# Patient Record
Sex: Male | Born: 1955 | Race: Black or African American | Hispanic: No | Marital: Single | Smoking: Never smoker
Health system: Southern US, Community
[De-identification: ages and names within clinical notes are randomized; demographics above are authoritative.]

## PROBLEM LIST (undated history)

## (undated) DIAGNOSIS — Z789 Other specified health status: Secondary | ICD-10-CM

## (undated) DIAGNOSIS — H5789 Other specified disorders of eye and adnexa: Secondary | ICD-10-CM

## (undated) HISTORY — PX: TONSILLECTOMY: SUR1361

---

## 2014-11-03 ENCOUNTER — Telehealth: Payer: Self-pay

## 2014-11-03 NOTE — Telephone Encounter (Signed)
Pt called this morning and gave me his triage info. He is at work and he will call me this afternoon for date and time.

## 2014-11-04 NOTE — Telephone Encounter (Signed)
Gastroenterology Pre-Procedure Review  Request Date: 11/03/2014 Requesting Physician: Dr. Legrand Rams  PATIENT REVIEW QUESTIONS: The patient responded to the following health history questions as indicated:    1. Diabetes Melitis: no 2. Joint replacements in the past 12 months: no 3. Major health problems in the past 3 months: no 4. Has an artificial valve or MVP: no 5. Has a defibrillator: no 6. Has been advised in past to take antibiotics in advance of a procedure like teeth cleaning: no    MEDICATIONS & ALLERGIES:    Patient reports the following regarding taking any blood thinners:   Plavix? no Aspirin? no Coumadin? no  Patient confirms/reports the following medications:  No current outpatient prescriptions on file.   No current facility-administered medications for this visit.    Patient confirms/reports the following allergies:  Allergies  Allergen Reactions  . Penicillins Other (See Comments)    PT SAID HE PASSED OUT MANY YEARS AGO FROM PENICILLIN    No orders of the defined types were placed in this encounter.    AUTHORIZATION INFORMATION Primary Insurance:   ID #:  Group #: Pre-Cert / Auth required:  Pre-Cert / Auth #:   Secondary Insurance:   ID #:   Group #:  Pre-Cert / Auth required:  Pre-Cert / Auth #:   SCHEDULE INFORMATION: Procedure has been scheduled as follows:  Date: 11/14/2014          Time:  9:30 AM [facility] Short Stay  This Gastroenterology Pre-Precedure Review Form is being routed to the following provider(s): R. Garfield Cornea, MD

## 2014-11-08 ENCOUNTER — Other Ambulatory Visit: Payer: Self-pay

## 2014-11-08 DIAGNOSIS — Z1211 Encounter for screening for malignant neoplasm of colon: Secondary | ICD-10-CM

## 2014-11-08 MED ORDER — PEG-KCL-NACL-NASULF-NA ASC-C 100 G PO SOLR: 1.0000 | ORAL | Status: DC

## 2014-11-08 NOTE — Telephone Encounter (Signed)
Rx sent to the pharmacy and instructions mailed to pt.  

## 2014-11-08 NOTE — Telephone Encounter (Signed)
[place]  Please notify us immediately if you are diabetic, take iron supplements, or if you are on coumadin or any blood thinners.  Patient Name:  Ryan Frost Date of procedure:  11/14/2014 Time to register at [facility] Stay:  8:30 AM Provider:  Dr. Gala Romney  Please hold the following medications: N/A  11/12/2014  2 Days prior to procedure:  [place]!  11/13/2014  1 Day prior to procedure:     CLEAR LIQUIDS ALL DAY--NO SOLID FOODS!  Diabetic Medication Instructions:  N/A   At 5:00 PM Begin the prep as follows:     Empty 1 pouch A and 1 pouch B into disposable container.  Add lukewarm drinking water to the top line of the container.  Mix to dissolve.  You can mix solution ahead of time & refrigerate prior to drinking.  The solution should be used within 24 hours.  The container is divided by 4 marks.  Every 15 minutes, drink the solution down to the next mark (approx 8 oz) until the liter is complete.  Be sure to drink 16ounces of clear liquid of your choice   At 7:30 PM repeat:  Empty 1 pouch A and 1 pouch B into disposable container.  Add lukewarm drinking water to the top line of the container.  Mix to dissolve.  You can mix solution ahead of time & refrigerate prior to drinking.  The solution should be used within 24 hours.  The container is divided by 4 marks.  Every 15 minutes, drink the solution down to the next mark (approx 8 oz) until the liter is complete.  You must complete the entire prep to ensure the most effective cleaning.   CONTINUE CLEAR LIQUIDS UNTIL MIDNIGHT.     NOTHING TO EAT OR DRINK AFTER MIDNIGHT  11/14/2014  Day of Procedure  Give yourself one Fleet enema about 1 hour prior to leaving for the hospital.  You may take TYLENOL products.  Please continue your regular medications unless we have instructed you otherwise.   Please note, on the day of your procedure you MUST be accompanied  by an adult who is willing to assume responsibility for you at time of discharge. If you do not have such person with you, your procedure will have to be rescheduled.  *It is your responsibility to check with your insurance company for the benefits of coverage you have for this procedure. Unfortunately, not all insurance companies have benefits to cover all or part of these types of procedures. It is your responsibility to check your benefits, however we will be glad to assist you with any codes your insurance company may need.   Please note that most insurance companies will not cover a screening colonoscopy for people under the age of 58. For example, with some insurance companies you may have benefits for a screening colonoscopy, but if polyps are found the diagnosis will change and then you may have a deductible that will need to be met. Please make sure you check your benefits for screening colonoscopy as well as a diagnostic colonoscopy.   CLEAR LIQUIDS: (NO RED) Jello Apple Juice  White Grape Juice Water Banana popsicles  Kool-Aid  Coffee(No cream or milk) Tea (No cream or milk) Soft drinks Broth (fat free beef/chicken/vegetable)  Clear liquids allow you to see your fingers on the other side of the glass.  Be sure they are NOT RED in color, cloudy, but CLEAR.  Do Not Eat: Dairy products of any kind Cranberry juice Tomato or V8 Juice  Orange Juice Grapefruit Juice  Red Grape Juice Solid foods like cereal, oatmeal, yogurt, fruits, vegetables, creamed soups, eggs, bread, etc   HELPFUL HINTS TO MAKE DRINKING EASIER: -Make sure prep is extremely COLD.  Refrigerate the night before.  You may also put in freezer. -You may try mixing Crystal Light or Country Time Lemonade if you prefer.  MIx in small amounts.  Add more if necessary. -Trying drinking through a straw. -Rinse mouth with water or mouthwash between glasses to remove aftertaste. -Try sipping on a cold beverage/ice popsicles  between glasses of prep. -Place a piece of sugar-free hard candy in mouth between glasses. -If you become nauseated, try consuming smaller amounts or stretch out the time between glasses.  Stop for 30 minutes to an hour & slowly start back drinking.  Call our office with any questions or concerns at [phone].  Thank You,  _0 @

## 2014-11-08 NOTE — Telephone Encounter (Signed)
OK to schedule

## 2014-11-09 ENCOUNTER — Telehealth: Payer: Self-pay

## 2014-11-09 NOTE — Telephone Encounter (Signed)
I called [employer] @1 [phone] and spoke to [place] who said that a PA is not required for screening colonoscopy as outpatient.

## 2014-11-11 NOTE — Telephone Encounter (Signed)
Pt's wife called and said he had not received his instructions for the prep in the mail. He has already picked up the prescription. She did not have a fax that I could fax the instructions to so I told her I will fax them to the pharmacy with a note to the pharmacist that the pt has already picked up his prescription. Faxed the instructions to: [phone].  I called the pharmacy @ [phone] and spoke to tech, Katharine Look, who said they have received the instructions and will keep them for the pt to pick up.

## 2014-11-14 ENCOUNTER — Encounter (HOSPITAL_COMMUNITY)
Admission: RE | Disposition: A | Discharge status: Home / Self Care | Payer: Self-pay | Source: Ambulatory Visit | Attending: Internal Medicine

## 2014-11-14 ENCOUNTER — Encounter (HOSPITAL_COMMUNITY): Payer: Self-pay | Admitting: *Deleted

## 2014-11-14 ENCOUNTER — Ambulatory Visit (HOSPITAL_COMMUNITY)
Admission: RE | Admit: 2014-11-14 | Discharge: 2014-11-14 | Disposition: A | Discharge status: Home / Self Care | Payer: BLUE CROSS/BLUE SHIELD | Source: Ambulatory Visit | Attending: Internal Medicine | Admitting: Internal Medicine

## 2014-11-14 DIAGNOSIS — K5731 Diverticulosis of large intestine without perforation or abscess with bleeding: Secondary | ICD-10-CM | POA: Insufficient documentation

## 2014-11-14 DIAGNOSIS — Z880 Allergy status to penicillin: Secondary | ICD-10-CM | POA: Diagnosis not present

## 2014-11-14 DIAGNOSIS — D123 Benign neoplasm of transverse colon: Secondary | ICD-10-CM | POA: Diagnosis not present

## 2014-11-14 DIAGNOSIS — K5730 Diverticulosis of large intestine without perforation or abscess without bleeding: Secondary | ICD-10-CM | POA: Diagnosis not present

## 2014-11-14 DIAGNOSIS — Z86010 Personal history of colonic polyps: Secondary | ICD-10-CM | POA: Insufficient documentation

## 2014-11-14 DIAGNOSIS — Z1211 Encounter for screening for malignant neoplasm of colon: Secondary | ICD-10-CM | POA: Diagnosis present

## 2014-11-14 HISTORY — PX: COLONOSCOPY: SHX5424

## 2014-11-14 HISTORY — DX: Other specified disorders of eye and adnexa: H57.89

## 2014-11-14 HISTORY — DX: Other specified health status: Z78.9

## 2014-11-14 SURGERY — COLONOSCOPY
Anesthesia: Moderate Sedation

## 2014-11-14 MED ORDER — SODIUM CHLORIDE 0.9 % IV SOLN
INTRAVENOUS | Status: DC
  Administered 2014-11-14: 09:00:00 via INTRAVENOUS

## 2014-11-14 MED ORDER — MIDAZOLAM HCL 5 MG/5ML IJ SOLN
INTRAMUSCULAR | Status: AC
  Filled 2014-11-14: qty 10

## 2014-11-14 MED ORDER — ONDANSETRON HCL 4 MG/2ML IJ SOLN
INTRAMUSCULAR | Status: DC | PRN
  Administered 2014-11-14: 4 mg via INTRAVENOUS

## 2014-11-14 MED ORDER — MEPERIDINE HCL 100 MG/ML IJ SOLN
INTRAMUSCULAR | Status: DC | PRN
  Administered 2014-11-14: 25 mg via INTRAVENOUS
  Administered 2014-11-14: 50 mg via INTRAVENOUS

## 2014-11-14 MED ORDER — MEPERIDINE HCL 100 MG/ML IJ SOLN
INTRAMUSCULAR | Status: AC
  Filled 2014-11-14: qty 2

## 2014-11-14 MED ORDER — STERILE WATER FOR IRRIGATION IR SOLN
Status: DC | PRN
  Administered 2014-11-14: 09:00:00

## 2014-11-14 MED ORDER — MIDAZOLAM HCL 5 MG/5ML IJ SOLN
INTRAMUSCULAR | Status: DC | PRN
  Administered 2014-11-14: 1 mg via INTRAVENOUS
  Administered 2014-11-14: 2 mg via INTRAVENOUS
  Administered 2014-11-14: 1 mg via INTRAVENOUS

## 2014-11-14 MED ORDER — ONDANSETRON HCL 4 MG/2ML IJ SOLN
INTRAMUSCULAR | Status: AC
  Filled 2014-11-14: qty 2

## 2014-11-14 NOTE — H&P (Signed)
@  SUPJ@   Primary Care Physician:  Rosita Fire, MD Primary Gastroenterologist:  Dr. Gala Romney  Pre-Procedure History & Physical: HPI:  Ryan Frost is a 59 y.o. male is here for a screening colonoscopy. No bowel symptoms. No prior colonoscopy. No family history of colon cancer.  Past Medical History  Diagnosis Date  . Medical history non-contributory   . Red eye     left eye    Past Surgical History  Procedure Laterality Date  . Tonsillectomy      Prior to Admission medications   Medication Sig Start Date End Date Taking? Authorizing Provider  peg 3350 powder (MOVIPREP) 100 G SOLR Take 1 kit (200 g total) by mouth as directed. 11/08/14  Yes Daneil Dolin, MD    Allergies as of 11/08/2014 - Review Complete 11/08/2014  Allergen Reaction Noted  . Penicillins Other (See Comments) 11/03/2014    History reviewed. No pertinent family history.  History   Social History  . Marital Status: Single    Spouse Name: N/A    Number of Children: N/A  . Years of Education: N/A   Occupational History  . Not on file.   Social History Main Topics  . Smoking status: Never Smoker   . Smokeless tobacco: Not on file  . Alcohol Use: Yes     Comment: 2 or 3 liquor on weekend  . Drug Use: Not on file  . Sexual Activity: Not on file   Other Topics Concern  . Not on file   Social History Narrative  . No narrative on file    Review of Systems: See HPI, otherwise negative ROS  Physical Exam: BP 130/85 mmHg  Pulse 59  Temp(Src) 98.1 F (36.7 C) (Oral)  Resp 18  SpO2 97% General:   Alert,  Well-developed, well-nourished, pleasant and cooperative in NAD Head:  Normocephalic and atraumatic. Eyes:  Sclera clear, no icterus.   Conjunctiva pink. Ears:  Normal auditory acuity. Nose:  No deformity, discharge,  or lesions. Mouth:  No deformity or lesions, dentition normal. Neck:  Supple; no masses or thyromegaly. Lungs:  Clear throughout to auscultation.   No wheezes, crackles,  or rhonchi. No acute distress. Heart:  Regular rate and rhythm; no murmurs, clicks, rubs,  or gallops. Abdomen:  Soft, nontender and nondistended. No masses, hepatosplenomegaly or hernias noted. Normal bowel sounds, without guarding, and without rebound.   Msk:  Symmetrical without gross deformities. Normal posture. Pulses:  Normal pulses noted. Extremities:  Without clubbing or edema. Neurologic:  Alert and  oriented x4;  grossly normal neurologically. Skin:  Intact without significant lesions or rashes. Cervical Nodes:  No significant cervical adenopathy. Psych:  Alert and cooperative. Normal mood and affect.  Impression/Plan: Ryan Frost is now here to undergo a screening colonoscopy.  First ever average risk screening examination. Risks, benefits, limitations, imponderables and alternatives regarding colonoscopy have been reviewed with the patient. Questions have been answered. All parties agreeable.     Notice:  This dictation was prepared with Dragon dictation along with smaller phrase technology. Any transcriptional errors that result from this process are unintentional and may not be corrected upon review.

## 2014-11-14 NOTE — Op Note (Signed)
[facility] [address] [place], [address]   COLONOSCOPY PROCEDURE REPORT  PATIENT: Ryan Frost, Ryan Frost  MR#: [id number] BIRTHDATE: [birth year] , 80  yrs. old GENDER: male ENDOSCOPIST: R.  Garfield Cornea, MD FACP [facility] REFERRED AO:[id number] Legrand Rams, M.D. PROCEDURE DATE:  December 07, 2014 PROCEDURE:   Colonoscopy with biopsy and Colonoscopy with snare polypectomy INDICATIONS:First ever average risk colorectal cancer screening examination. MEDICATIONS: Versed 4 mg IV and Demerol 75 mg IV in divided doses. Zofran 4 mg IV. ASA CLASS:       Class II  CONSENT: The risks, benefits, alternatives and imponderables including but not limited to bleeding, perforation as well as the possibility of a missed lesion have been reviewed.  The potential for biopsy, lesion removal, etc. have also been discussed. Questions have been answered.  All parties agreeable.  Please see the history and physical in the medical record for more information.  DESCRIPTION OF PROCEDURE:   After the risks benefits and alternatives of the procedure were thoroughly explained, informed consent was obtained.  The digital rectal exam revealed no abnormalities of the rectum.   The EC-3890Li ([id number])  endoscope was introduced through the anus and advanced to the cecum, which was identified by both the appendix and ileocecal valve. No adverse events experienced.   The quality of the prep was adequate.  The instrument was then slowly withdrawn as the colon was fully examined.      COLON FINDINGS: Normal-appearing rectum.  normal retroflexion (photographed but technical difficulties precluded capturing of the image).  Scattered pancolonic diverticula; (1) 6 mm polyp at the splenic flexure and a second diminutive polyp in the mid transverse segment; otherwise, remainder of the colonic mucosa appeared normal.   The above-mentioned polyps were cold biopsied with jumbo biopsy forceps and hot snare removed.   Retroflexion was performed. .  Withdrawal time=15 minutes 0 seconds.  The scope was withdrawn and the procedure completed. COMPLICATIONS: There were no immediate complications.  ENDOSCOPIC IMPRESSION: Pancolonic diverticulosis. Multiple colonic polyps?"removed as described above.  RECOMMENDATIONS: Follow-up on pathology.  eSigned:  R. Garfield Cornea, MD Rosalita Chessman [facility] Dec 07, 2014 9:44 AM   cc:  CPT CODES: ICD CODES:  The ICD and CPT codes recommended by this software are interpretations from the data that the clinical staff has captured with the software.  The verification of the translation of this report to the ICD and CPT codes and modifiers is the sole responsibility of the health care institution and practicing physician where this report was generated.  [employer] will not be held responsible for the validity of the ICD and CPT codes included on this report.  AMA assumes no liability for data contained or not contained herein. CPT is a Designer, television/film set of the [employer].  PATIENT NAME:  Ryan Frost, Ryan Frost MR#: [id number]

## 2014-11-14 NOTE — Discharge Instructions (Signed)
Diverticulosis and polyp information provided  Further recommendations to follow pending review of pathology report   Colonoscopy Discharge Instructions  Read the instructions outlined below and refer to this sheet in the next few weeks. These discharge instructions provide you with general information on caring for yourself after you leave the hospital. Your doctor may also give you specific instructions. While your treatment has been planned according to the most current medical practices available, unavoidable complications occasionally occur. If you have any problems or questions after discharge, call Dr. Gala Romney at [phone]. ACTIVITY  You may resume your regular activity, but move at a slower pace for the next 24 hours.   Take frequent rest periods for the next 24 hours.   Walking will help get rid of the air and reduce the bloated feeling in your belly (abdomen).   No driving for 24 hours (because of the medicine (anesthesia) used during the test).    Do not sign any important legal documents or operate any machinery for 24 hours (because of the anesthesia used during the test).  NUTRITION  Drink plenty of fluids.   You may resume your normal diet as instructed by your doctor.   Begin with a light meal and progress to your normal diet. Heavy or fried foods are harder to digest and may make you feel sick to your stomach (nauseated).   Avoid alcoholic beverages for 24 hours or as instructed.  MEDICATIONS  You may resume your normal medications unless your doctor tells you otherwise.  WHAT YOU CAN EXPECT TODAY  Some feelings of bloating in the abdomen.   Passage of more gas than usual.   Spotting of blood in your stool or on the toilet paper.  IF YOU HAD POLYPS REMOVED DURING THE COLONOSCOPY:  No aspirin products for 7 days or as instructed.   No alcohol for 7 days or as instructed.   Eat a soft diet for the next 24 hours.  FINDING OUT THE RESULTS OF YOUR TEST Not all  test results are available during your visit. If your test results are not back during the visit, make an appointment with your caregiver to find out the results. Do not assume everything is normal if you have not heard from your caregiver or the medical facility. It is important for you to follow up on all of your test results.  SEEK IMMEDIATE MEDICAL ATTENTION IF:  You have more than a spotting of blood in your stool.   Your belly is swollen (abdominal distention).   You are nauseated or vomiting.   You have a temperature over 101.   You have abdominal pain or discomfort that is severe or gets worse throughout the day.    Diverticulosis Diverticulosis is the condition that develops when small pouches (diverticula) form in the wall of your colon. Your colon, or large intestine, is where water is absorbed and stool is formed. The pouches form when the inside layer of your colon pushes through weak spots in the outer layers of your colon. CAUSES  No one knows exactly what causes diverticulosis. RISK FACTORS  Being older than 32. Your risk for this condition increases with age. Diverticulosis is rare in people younger than 40 years. By age 30, almost everyone has it.  Eating a low-fiber diet.  Being frequently constipated.  Being overweight.  Not getting enough exercise.  Smoking.  Taking over-the-counter pain medicines, like aspirin and ibuprofen. SYMPTOMS  Most people with diverticulosis do not have symptoms. DIAGNOSIS  diverticulosis often has no symptoms, health care providers often discover the condition during an exam for other colon problems. In many cases, a health care provider will diagnose diverticulosis while using a flexible scope to examine the colon (colonoscopy). °TREATMENT  °If you have never developed an infection related to diverticulosis, you may not need treatment. If you have had an infection before, treatment may include: °· Eating more fruits,  vegetables, and grains. °· Taking a fiber supplement. °· Taking a live bacteria supplement (probiotic). °· Taking medicine to relax your colon. °HOME CARE INSTRUCTIONS  °· Drink at least 6-8 glasses of water each day to prevent constipation. °· Try not to strain when you have a bowel movement. °· Keep all follow-up appointments. °If you have had an infection before:  °· Increase the fiber in your diet as directed by your health care provider or dietitian. °· Take a dietary fiber supplement if your health care provider approves. °· Only take medicines as directed by your health care provider. °SEEK MEDICAL CARE IF:  °· You have abdominal pain. °· You have bloating. °· You have cramps. °· You have not gone to the bathroom in 3 days. °SEEK IMMEDIATE MEDICAL CARE IF:  °· Your pain gets worse. °· Your bloating becomes very bad. °· You have a fever or chills, and your symptoms suddenly get worse. °· You begin vomiting. °· You have bowel movements that are bloody or black. °MAKE SURE YOU: °· Understand these instructions. °· Will watch your condition. °· Will get help right away if you are not doing well or get worse. °Document Released: 07/11/2004 Document Revised: 10/19/2013 Document Reviewed: 09/08/2013 °Exit[employer]® Patient Information ©2015 [employer]. This information is not intended to replace advice given to you by your health care provider. Make sure you discuss any questions you have with your health care provider. ° ° °Colon Polyps °Polyps are lumps of extra tissue growing inside the body. Polyps can grow in the large intestine (colon). Most colon polyps are noncancerous (benign). However, some colon polyps can become cancerous over time. Polyps that are larger than a pea may be harmful. To be safe, caregivers remove and test all polyps. °CAUSES  °Polyps form when mutations in the genes cause your cells to grow and divide even though no more tissue is needed. °RISK FACTORS °There are a number of risk factors  that can increase your chances of getting colon polyps. They include: °· Being older than 50 years. °· Family history of colon polyps or colon cancer. °· Long-term colon diseases, such as colitis or Crohn disease. °· Being overweight. °· Smoking. °· Being inactive. °· Drinking too much alcohol. °SYMPTOMS  °Most small polyps do not cause symptoms. If symptoms are present, they may include: °· Blood in the stool. The stool may look dark red or black. °· Constipation or diarrhea that lasts longer than 1 week. °DIAGNOSIS °People often do not know they have polyps until their caregiver finds them during a regular checkup. Your caregiver can use 4 tests to check for polyps: °· Digital rectal exam. The caregiver wears gloves and feels inside the rectum. This test would find polyps only in the rectum. °· Barium enema. The caregiver puts a liquid called barium into your rectum before taking X-rays of your colon. Barium makes your colon look Sewell Pitner. Polyps are dark, so they are easy to see in the X-ray pictures. °· Sigmoidoscopy. A thin, flexible tube (sigmoidoscope) is placed into your rectum. The sigmoidoscope has a light and tiny camera   camera in it. The caregiver uses the sigmoidoscope to look at the last third of your colon.  Colonoscopy. This test is like sigmoidoscopy, but the caregiver looks at the entire colon. This is the most common method for finding and removing polyps. TREATMENT  Any polyps will be removed during a sigmoidoscopy or colonoscopy. The polyps are then tested for cancer. PREVENTION  To help lower your risk of getting more colon polyps:  Eat plenty of fruits and vegetables. Avoid eating fatty foods.  Do not smoke.  Avoid drinking alcohol.  Exercise every day.  Lose weight if recommended by your caregiver.  Eat plenty of calcium and folate. Foods that are rich in calcium include milk, cheese, and broccoli. Foods that are rich in folate include chickpeas, kidney beans, and spinach. HOME CARE  INSTRUCTIONS Keep all follow-up appointments as directed by your caregiver. You may need periodic exams to check for polyps. SEEK MEDICAL CARE IF: You notice bleeding during a bowel movement. Document Released: 07/10/2004 Document Revised: 01/06/2012 Document Reviewed: 12/24/2011 [facility] Patient Information 2015 [place], [place]. This information is not intended to replace advice given to you by your health care provider. Make sure you discuss any questions you have with your health care provider.

## 2014-11-15 ENCOUNTER — Encounter: Payer: Self-pay | Admitting: Internal Medicine

## 2014-11-15 ENCOUNTER — Encounter (HOSPITAL_COMMUNITY): Payer: Self-pay | Admitting: Internal Medicine

## 2014-12-06 ENCOUNTER — Other Ambulatory Visit (HOSPITAL_COMMUNITY): Payer: Self-pay | Admitting: Internal Medicine

## 2014-12-06 ENCOUNTER — Ambulatory Visit (HOSPITAL_COMMUNITY)
Admission: RE | Admit: 2014-12-06 | Discharge: 2014-12-06 | Disposition: A | Discharge status: Home / Self Care | Payer: BLUE CROSS/BLUE SHIELD | Source: Ambulatory Visit | Attending: Internal Medicine | Admitting: Internal Medicine

## 2014-12-06 DIAGNOSIS — M25551 Pain in right hip: Secondary | ICD-10-CM | POA: Diagnosis present

## 2014-12-06 DIAGNOSIS — M1611 Unilateral primary osteoarthritis, right hip: Secondary | ICD-10-CM | POA: Insufficient documentation

## 2016-03-06 IMAGING — DX DG HIP (WITH OR WITHOUT PELVIS) 2-3V*R*
3 series · 3 of 3 positions shown · non-contrast
Comparison: None.

CLINICAL DATA: Right hip pain with no known injury.

EXAM:
RIGHT HIP (WITH PELVIS) 2-3 VIEWS

[pelvis ap]
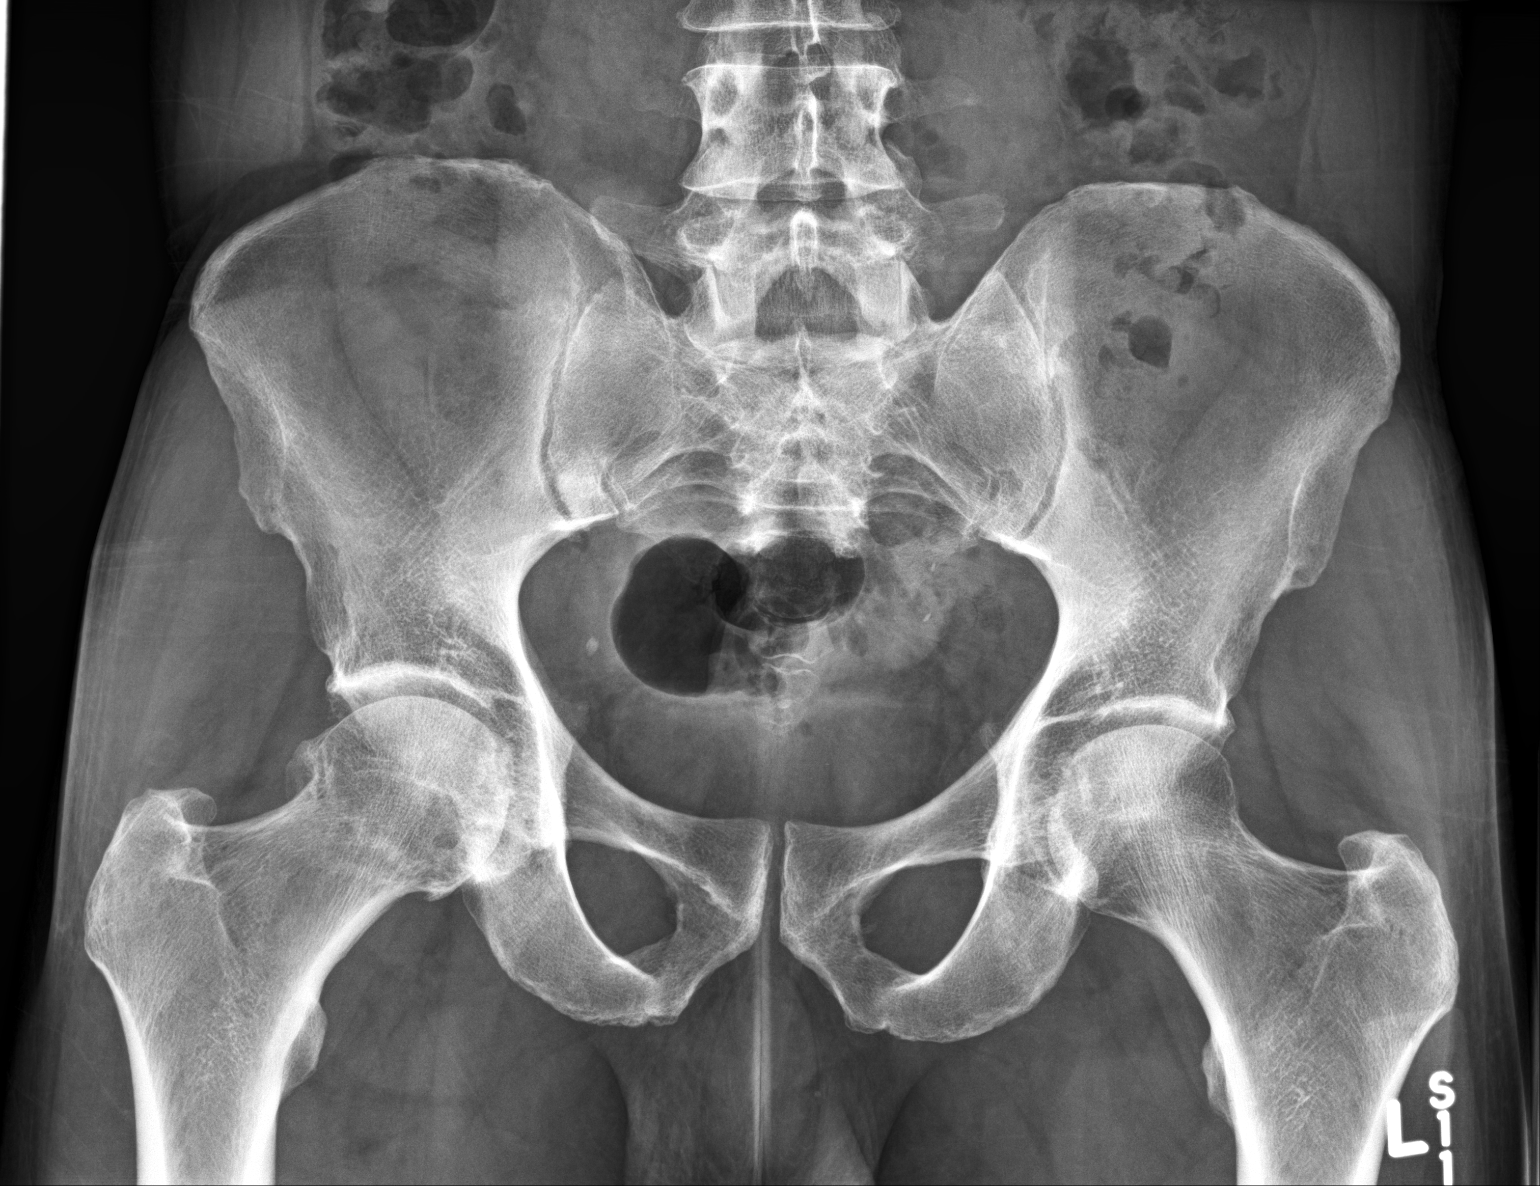

[hip ap]
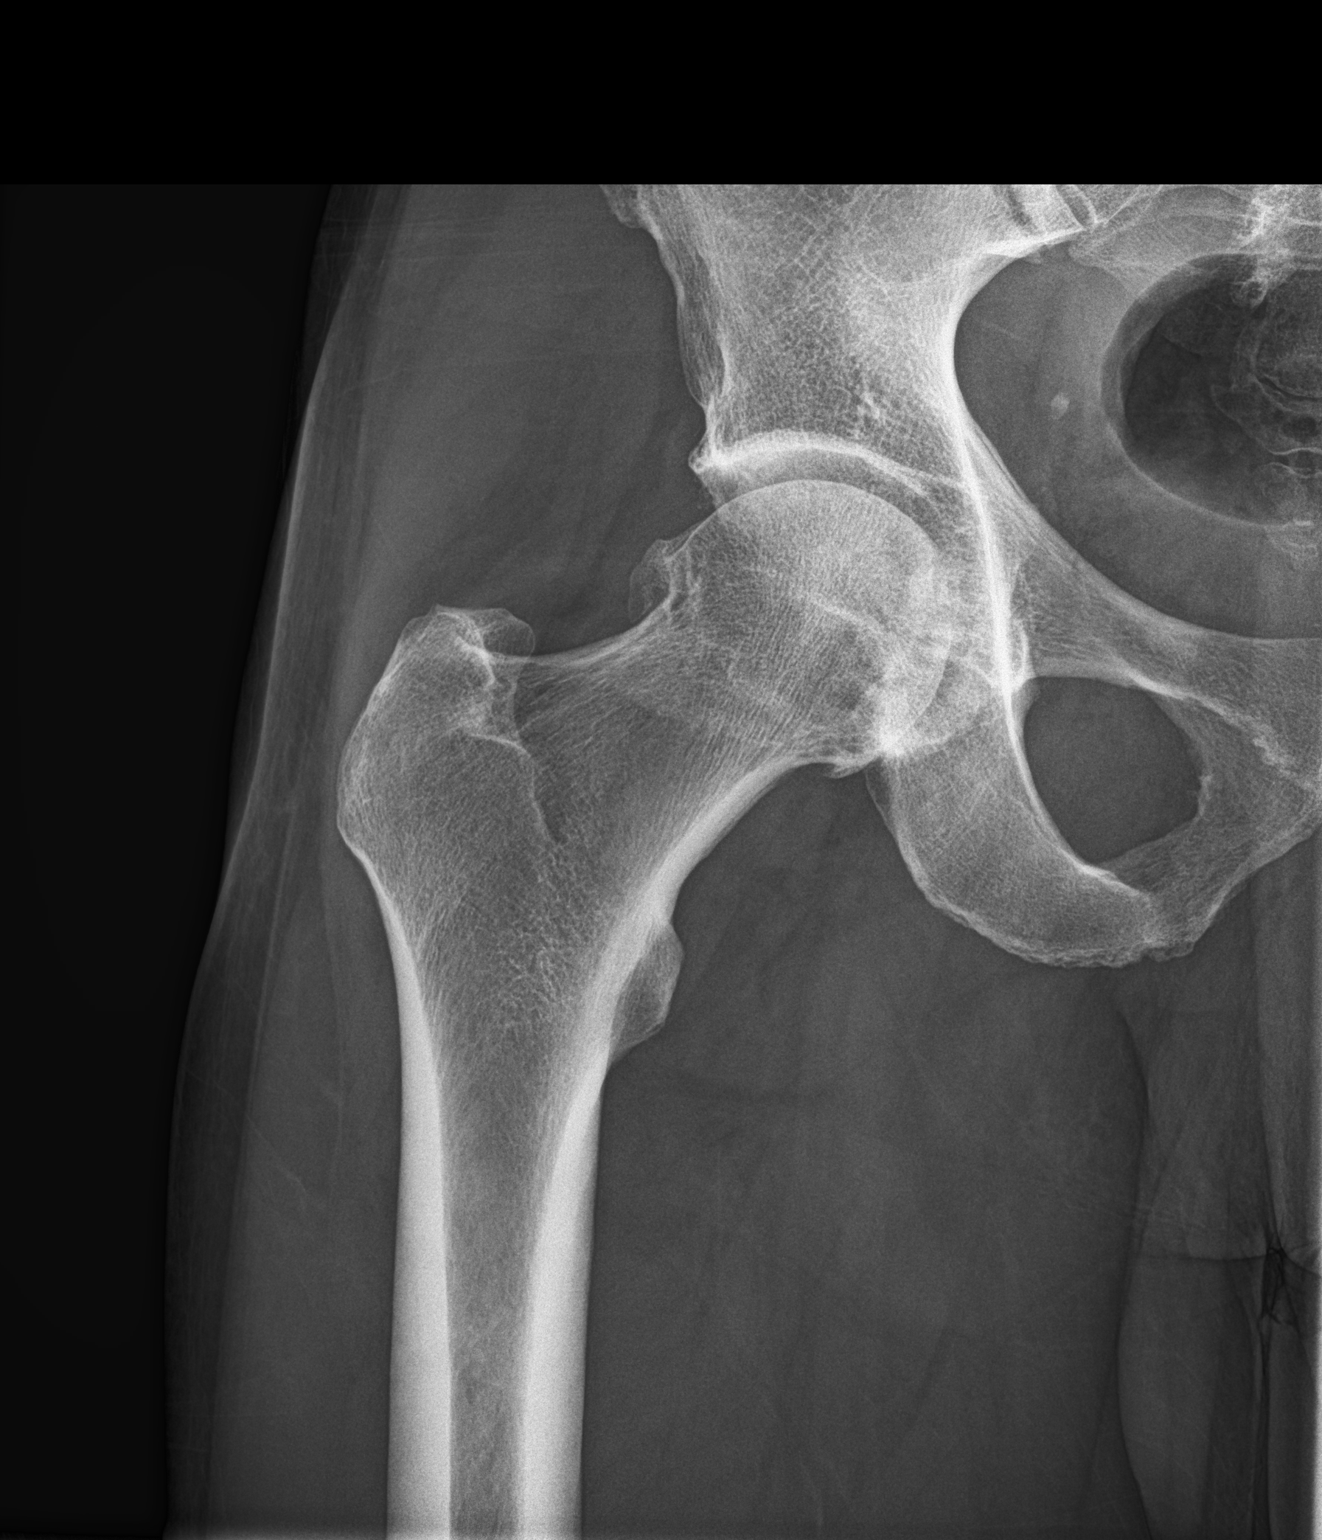

[hip lat]
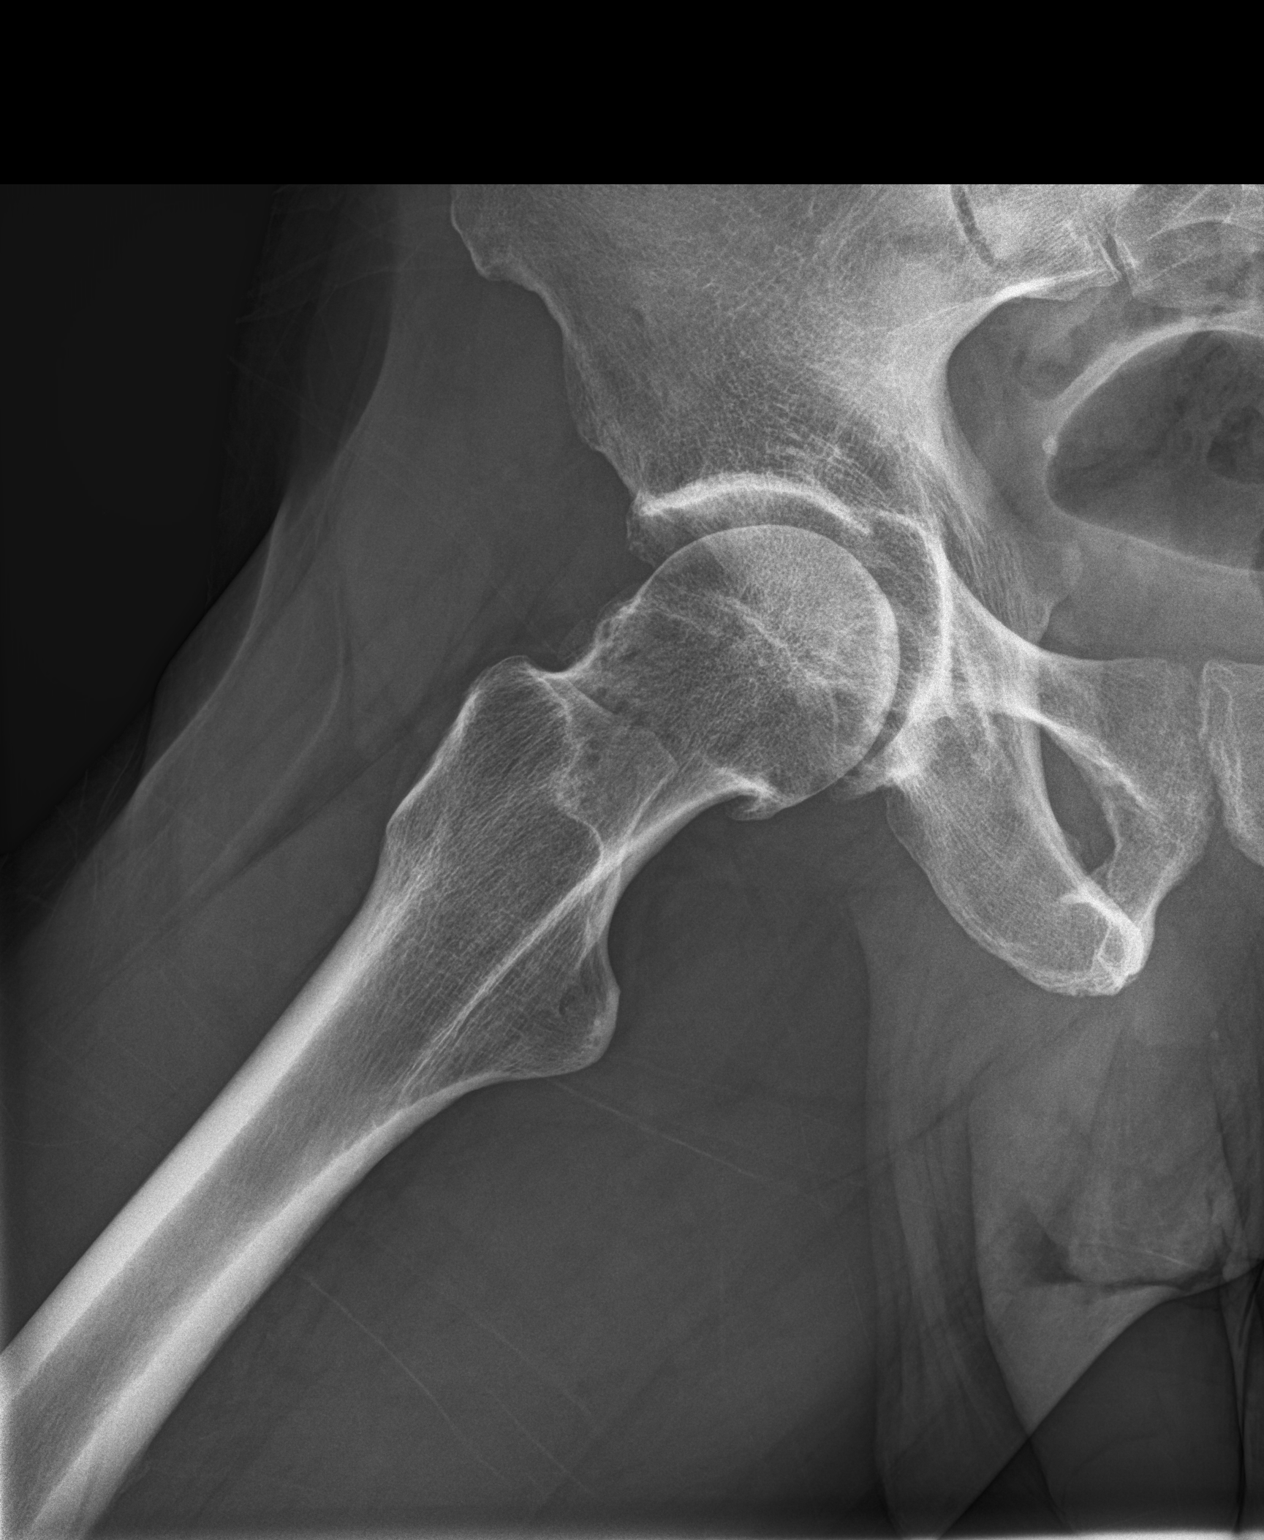

[3 of 3 positions shown; findings below may reference images not displayed]

FINDINGS: Right hip osteoarthritis. Although joint narrowing is minimal to
absent, there is prominent ring of osteophytes about the femoral
neck. No focal bone lesion or evidence of osteonecrosis. The pelvic
ring is intact. No sacroiliac erosion/arthritis.
IMPRESSION: Moderate osteoarthritis of the right hip.

## 2019-07-04 ENCOUNTER — Other Ambulatory Visit: Payer: Self-pay

## 2019-07-04 ENCOUNTER — Emergency Department (HOSPITAL_COMMUNITY)
Admission: EM | Admit: 2019-07-04 | Discharge: 2019-07-04 | Disposition: A | Discharge status: Home / Self Care | Payer: BC Managed Care – PPO | Attending: Emergency Medicine | Admitting: Emergency Medicine

## 2019-07-04 ENCOUNTER — Encounter (HOSPITAL_COMMUNITY): Payer: Self-pay

## 2019-07-04 DIAGNOSIS — R55 Syncope and collapse: Secondary | ICD-10-CM | POA: Diagnosis not present

## 2019-07-04 LAB — CBC
HCT: 43.6 % (ref 39.0–52.0)
Hemoglobin: 14 g/dL (ref 13.0–17.0)
MCH: 29.6 pg (ref 26.0–34.0)
MCHC: 32.1 g/dL (ref 30.0–36.0)
MCV: 92.2 fL (ref 80.0–100.0)
Platelets: 216 10*3/uL (ref 150–400)
RBC: 4.73 MIL/uL (ref 4.22–5.81)
RDW: 13 % (ref 11.5–15.5)
WBC: 4.3 10*3/uL (ref 4.0–10.5)
nRBC: 0 % (ref 0.0–0.2)

## 2019-07-04 LAB — BASIC METABOLIC PANEL
Anion gap: 5 (ref 5–15)
BUN: 17 mg/dL (ref 8–23)
CO2: 30 mmol/L (ref 22–32)
Calcium: 9 mg/dL (ref 8.9–10.3)
Chloride: 104 mmol/L (ref 98–111)
Creatinine, Ser: 1.34 mg/dL — ABNORMAL HIGH (ref 0.61–1.24)
GFR calc Af Amer: >60 mL/min (ref >60)
GFR calc non Af Amer: 56 mL/min — ABNORMAL LOW (ref >60)
Glucose, Bld: 90 mg/dL (ref 70–99)
Potassium: 4 mmol/L (ref 3.5–5.1)
Sodium: 139 mmol/L (ref 135–145)

## 2019-07-04 LAB — CBG MONITORING, ED: Glucose-Capillary: 71 mg/dL (ref 70–99)

## 2019-07-04 MED ORDER — SODIUM CHLORIDE 0.9% FLUSH: 3.0000 mL | Freq: Once | INTRAVENOUS | Status: DC

## 2019-07-04 NOTE — ED Triage Notes (Signed)
Pt reports that he was picking up food from a restaurant and when he went outside he got dizzy and almost passed out. He had to lay on the sidewalk. EMS checked him out and refused transport because he was in [place]. Dizziness has resolved

## 2019-07-04 NOTE — ED Provider Notes (Signed)
[facility] EMERGENCY DEPARTMENT Provider Note   CSN: [id number] Arrival date & time: 07/04/19  1616     History   Chief Complaint Chief Complaint  Patient presents with  . Near Syncope    HPI Ryan Frost is a 63 y.o. male.     HPI  63 year old male presents with near syncope.  The patient has not eaten since last night because he had a heavy meal last night.  This afternoon, approximately 3 hours after I am seeing him, he was at a pizza place picking up a to go order.  When walking out and into the heat he states that he felt acutely lightheaded like he was going to pass out.  Lasted about 5 minutes.  He had to go to the ground to get help to get up but he did not pass out or fall.  Never had headache, chest pain, shortness of breath and has not been sick recently with fever, vomiting, diarrhea, etc.  He states once he got into the car with cool [facility] he felt better.  He has also been walking around a lot today.  Past Medical History:  Diagnosis Date  . Medical history non-contributory   . Red eye    left eye    Patient Active Problem List   Diagnosis Date Noted  . History of colonic polyps   . Diverticulosis of colon with hemorrhage   . Diverticulosis of colon without hemorrhage     Past Surgical History:  Procedure Laterality Date  . COLONOSCOPY N/A 11/14/2014   Procedure: COLONOSCOPY;  Surgeon: Daneil Dolin, MD;  Location: [facility];  Service: Endoscopy;  Laterality: N/A;  9.30 AM  . TONSILLECTOMY          Home Medications    Prior to Admission medications   Medication Sig Start Date End Date Taking? Authorizing Provider  peg 3350 powder (MOVIPREP) 100 G SOLR Take 1 kit (200 g total) by mouth as directed. 11/08/14   Rourk, Cristopher Estimable, MD    Family History No family history on file.  Social History Social History   Tobacco Use  . Smoking status: Never Smoker  Substance Use Topics  . Alcohol use: Yes    Comment: 2 or 3 liquor on weekend  . Drug  use: Never     Allergies   Penicillins   Review of Systems Review of Systems  Constitutional: Negative for fever.  Respiratory: Negative for shortness of breath.   Cardiovascular: Negative for chest pain.  Gastrointestinal: Negative for diarrhea and vomiting.  Neurological: Positive for light-headedness. Negative for syncope and headaches.  All other systems reviewed and are negative.    Physical Exam Updated Vital Signs BP (!) 159/91 (BP Location: Right Arm)   Pulse 60   Temp 98 F (36.7 C) (Oral)   Resp 18   Ht 5' 11"  (1.803 m)   Wt 63.5 kg   SpO2 100%   BMI 19.53 kg/m   Physical Exam Vitals signs and nursing note reviewed.  Constitutional:      General: He is not in acute distress.    Appearance: He is well-developed. He is not ill-appearing or diaphoretic.  HENT:     Head: Normocephalic and atraumatic.     Right Ear: External ear normal.     Left Ear: External ear normal.     Nose: Nose normal.  Eyes:     General:        Right eye: No discharge.  Left eye: No discharge.  Neck:     Musculoskeletal: Neck supple.  Cardiovascular:     Rate and Rhythm: Normal rate and regular rhythm.     Heart sounds: Normal heart sounds. No murmur.  Pulmonary:     Effort: Pulmonary effort is normal.     Breath sounds: Normal breath sounds.  Abdominal:     Palpations: Abdomen is soft.     Tenderness: There is no abdominal tenderness.  Skin:    General: Skin is warm and dry.  Neurological:     Mental Status: He is alert.     Comments: CN 3-12 grossly intact. 5/5 strength in all 4 extremities. Grossly normal sensation. Normal finger to nose.   Psychiatric:        Mood and Affect: Mood is not anxious.      ED Treatments / Results  Labs (all labs ordered are listed, but only abnormal results are displayed) Labs Reviewed  BASIC METABOLIC PANEL - Abnormal; Notable for the following components:      Result Value   Creatinine, Ser 1.34 (*)    GFR calc non Af  Amer 56 (*)    All other components within normal limits  CBC  CBG MONITORING, ED    EKG EKG Interpretation  Date/Time:  Sunday July 04 2019 16:56:41 EDT Ventricular Rate:  64 PR Interval:  130 QRS Duration: 86 QT Interval:  404 QTC Calculation: 416 R Axis:   7 Text Interpretation:  Normal sinus rhythm no acute ST/T changes No old tracing to compare Confirmed by Sherwood Gambler [phone]) on 07/04/2019 4:59:34 PM   Radiology No results found.  Procedures Procedures (including critical care time)  Medications Ordered in ED Medications  sodium chloride flush (NS) 0.9 % injection 3 mL (has no administration in time range)     Initial Impression / Assessment and Plan / ED Course  I have reviewed the triage vital signs and the nursing notes.  Pertinent labs & imaging results that were available during my care of the patient were reviewed by me and considered in my medical decision making (see chart for details).        Patient's near syncope seems most likely to be related to not eating today, walking a lot and being out in the heat.  He is feeling fine currently.  Highly doubt cardiac cause.  Screening lab work is benign.  Discharge home with return precautions.  Final Clinical Impressions(s) / ED Diagnoses   Final diagnoses:  Near syncope    ED Discharge Orders    None       Sherwood Gambler, MD 07/04/19 2021

## 2019-10-13 ENCOUNTER — Encounter: Payer: Self-pay | Admitting: Internal Medicine

## 2021-05-03 ENCOUNTER — Other Ambulatory Visit (HOSPITAL_COMMUNITY): Payer: Self-pay | Admitting: Gerontology

## 2021-05-03 ENCOUNTER — Ambulatory Visit (HOSPITAL_COMMUNITY)
Admission: RE | Admit: 2021-05-03 | Discharge: 2021-05-03 | Disposition: A | Discharge status: Home / Self Care | Payer: BC Managed Care – PPO | Source: Ambulatory Visit | Attending: Gerontology | Admitting: Gerontology

## 2021-05-03 ENCOUNTER — Other Ambulatory Visit: Payer: Self-pay

## 2021-05-03 ENCOUNTER — Other Ambulatory Visit (HOSPITAL_COMMUNITY)
Admission: RE | Admit: 2021-05-03 | Discharge: 2021-05-03 | Disposition: A | Discharge status: Home / Self Care | Payer: BC Managed Care – PPO | Source: Ambulatory Visit | Attending: Internal Medicine | Admitting: Internal Medicine

## 2021-05-03 DIAGNOSIS — R7309 Other abnormal glucose: Secondary | ICD-10-CM | POA: Diagnosis present

## 2021-05-03 DIAGNOSIS — M25551 Pain in right hip: Secondary | ICD-10-CM | POA: Insufficient documentation

## 2021-05-03 DIAGNOSIS — N400 Enlarged prostate without lower urinary tract symptoms: Secondary | ICD-10-CM | POA: Diagnosis not present

## 2021-05-03 DIAGNOSIS — Z0001 Encounter for general adult medical examination with abnormal findings: Secondary | ICD-10-CM | POA: Diagnosis present

## 2021-05-03 DIAGNOSIS — R7303 Prediabetes: Secondary | ICD-10-CM | POA: Diagnosis present

## 2021-05-03 LAB — LIPID PANEL
Cholesterol: 186 mg/dL (ref 0–200)
HDL: 65 mg/dL (ref >40)
LDL Cholesterol: 110 mg/dL — ABNORMAL HIGH (ref 0–99)
Total CHOL/HDL Ratio: 2.9 RATIO
Triglycerides: 56 mg/dL (ref <150)
VLDL: 11 mg/dL (ref 0–40)

## 2021-05-03 LAB — CBC WITH DIFFERENTIAL/PLATELET
Abs Immature Granulocytes: 0.01 10*3/uL (ref 0.00–0.07)
Basophils Absolute: 0 10*3/uL (ref 0.0–0.1)
Basophils Relative: 1 %
Eosinophils Absolute: 0 10*3/uL (ref 0.0–0.5)
Eosinophils Relative: 1 %
HCT: 42.7 % (ref 39.0–52.0)
Hemoglobin: 14.2 g/dL (ref 13.0–17.0)
Immature Granulocytes: 0 %
Lymphocytes Relative: 39 %
Lymphs Abs: 1.3 10*3/uL (ref 0.7–4.0)
MCH: 30.3 pg (ref 26.0–34.0)
MCHC: 33.3 g/dL (ref 30.0–36.0)
MCV: 91.2 fL (ref 80.0–100.0)
Monocytes Absolute: 0.3 10*3/uL (ref 0.1–1.0)
Monocytes Relative: 8 %
Neutro Abs: 1.7 10*3/uL (ref 1.7–7.7)
Neutrophils Relative %: 51 %
Platelets: 230 10*3/uL (ref 150–400)
RBC: 4.68 MIL/uL (ref 4.22–5.81)
RDW: 13.6 % (ref 11.5–15.5)
WBC: 3.3 10*3/uL — ABNORMAL LOW (ref 4.0–10.5)
nRBC: 0 % (ref 0.0–0.2)

## 2021-05-03 LAB — HEMOGLOBIN A1C
Hgb A1c MFr Bld: 6.2 % — ABNORMAL HIGH (ref 4.8–5.6)
Mean Plasma Glucose: 131.24 mg/dL

## 2021-05-03 LAB — BASIC METABOLIC PANEL
Anion gap: 7 (ref 5–15)
BUN: 16 mg/dL (ref 8–23)
CO2: 26 mmol/L (ref 22–32)
Calcium: 8.8 mg/dL — ABNORMAL LOW (ref 8.9–10.3)
Chloride: 103 mmol/L (ref 98–111)
Creatinine, Ser: 1.24 mg/dL (ref 0.61–1.24)
GFR, Estimated: >60 mL/min (ref >60)
Glucose, Bld: 96 mg/dL (ref 70–99)
Potassium: 3.9 mmol/L (ref 3.5–5.1)
Sodium: 136 mmol/L (ref 135–145)

## 2021-05-03 LAB — HEPATIC FUNCTION PANEL
ALT: 62 U/L — ABNORMAL HIGH (ref 0–44)
AST: 46 U/L — ABNORMAL HIGH (ref 15–41)
Albumin: 3.6 g/dL (ref 3.5–5.0)
Alkaline Phosphatase: 48 U/L (ref 38–126)
Bilirubin, Direct: 0.1 mg/dL (ref 0.0–0.2)
Indirect Bilirubin: 0.3 mg/dL (ref 0.3–0.9)
Total Bilirubin: 0.4 mg/dL (ref 0.3–1.2)
Total Protein: 7.3 g/dL (ref 6.5–8.1)

## 2021-05-03 LAB — PSA: Prostatic Specific Antigen: 4.94 ng/mL — ABNORMAL HIGH (ref 0.00–4.00)

## 2021-05-04 LAB — MICROALBUMIN / CREATININE URINE RATIO
Creatinine, Urine: 191.1 mg/dL
Microalb Creat Ratio: 12 mg/g creat (ref 0–29)
Microalb, Ur: 22.9 ug/mL — ABNORMAL HIGH

## 2021-07-26 ENCOUNTER — Encounter: Payer: Self-pay | Admitting: Urology

## 2021-07-26 ENCOUNTER — Other Ambulatory Visit: Payer: Self-pay

## 2021-07-26 ENCOUNTER — Ambulatory Visit (INDEPENDENT_AMBULATORY_CARE_PROVIDER_SITE_OTHER): Payer: Self-pay | Admitting: Urology

## 2021-07-26 VITALS — BP 147/98 | HR 66 | Temp 98.0°F | Ht 71.0 in | Wt 137.2 lb

## 2021-07-26 DIAGNOSIS — R972 Elevated prostate specific antigen [PSA]: Secondary | ICD-10-CM

## 2021-07-26 DIAGNOSIS — N138 Other obstructive and reflux uropathy: Secondary | ICD-10-CM

## 2021-07-26 DIAGNOSIS — R350 Frequency of micturition: Secondary | ICD-10-CM

## 2021-07-26 DIAGNOSIS — R3911 Hesitancy of micturition: Secondary | ICD-10-CM

## 2021-07-26 DIAGNOSIS — N401 Enlarged prostate with lower urinary tract symptoms: Secondary | ICD-10-CM

## 2021-07-26 DIAGNOSIS — R3912 Poor urinary stream: Secondary | ICD-10-CM

## 2021-07-26 DIAGNOSIS — R351 Nocturia: Secondary | ICD-10-CM

## 2021-07-26 DIAGNOSIS — R3915 Urgency of urination: Secondary | ICD-10-CM

## 2021-07-26 LAB — URINALYSIS, ROUTINE W REFLEX MICROSCOPIC
Bilirubin, UA: NEGATIVE
Glucose, UA: NEGATIVE
Ketones, UA: NEGATIVE
Leukocytes,UA: NEGATIVE
Nitrite, UA: NEGATIVE
Protein,UA: NEGATIVE
RBC, UA: NEGATIVE
Specific Gravity, UA: 1.025 (ref 1.005–1.030)
Urobilinogen, Ur: 0.2 mg/dL (ref 0.2–1.0)
pH, UA: 5.5 (ref 5.0–7.5)

## 2021-07-26 LAB — BLADDER SCAN AMB NON-IMAGING: Scan Result: 29

## 2021-07-26 MED ORDER — TAMSULOSIN HCL 0.4 MG PO CAPS: 0.4000 mg | ORAL_CAPSULE | Freq: Every day | ORAL | 11 refills | Status: DC

## 2021-07-26 NOTE — Progress Notes (Signed)
Urological Symptom Review  Patient is experiencing the following symptoms: Frequent urination Hard to postpone urination Get up at night to urinate Leakage of urine Trouble starting stream Have to strain to urinate Urinary tract infection Weak stream   Review of Systems  Gastrointestinal (upper)  : Negative for upper GI symptoms  Gastrointestinal (lower) : Negative for lower GI symptoms  Constitutional : Negative for symptoms  Skin: Negative for skin symptoms  Eyes: Negative for eye symptoms  Ear/Nose/Throat : Negative for Ear/Nose/Throat symptoms  Hematologic/Lymphatic: Negative for Hematologic/Lymphatic symptoms  Cardiovascular : Negative for cardiovascular symptoms  Respiratory : Negative for respiratory symptoms  Endocrine: Negative for endocrine symptoms  Musculoskeletal: Back pain  Neurological: Negative for neurological symptoms  Psychologic: Negative for psychiatric symptoms

## 2021-07-26 NOTE — Progress Notes (Signed)
Subjective: 1. Elevated PSA   2. BPH with urinary obstruction   3. Urinary frequency   4. Nocturia   5. Urgency of urination   6. Hesitancy   7. Weak urinary stream      Consult requested by Dr. Elon Jester.  Ryan Frost is a 65 yo male who is sent by Dr. Legrand Rams for an elevated PSA of 4.94 and a voiding symptoms that are long standing and stable with frequency q2hrs and nocturia x 4 with a reduced stream, urgency and hesitancy.  His IPSS is 24.   He has had no hematuria or dysuria.  He has no prior GU history.   He has no family history of prostate cancer.   He has some issues with ED with difficulty obtaining and maintaining a erection.  His UA is clear and his PVR is 41ml.     IPSS     Row Name 07/26/21 0900         International Prostate Symptom Score   How often have you had the sensation of not emptying your bladder? About half the time     How often have you had to urinate less than every two hours? Almost always     How often have you found you stopped and started again several times when you urinated? About half the time     How often have you found it difficult to postpone urination? More than half the time     How often have you had a weak urinary stream? About half the time     How often have you had to strain to start urination? Less than half the time     How many times did you typically get up at night to urinate? 4 Times     Total IPSS Score 24           Quality of Life due to urinary symptoms   If you were to spend the rest of your life with your urinary condition just the way it is now how would you feel about that? Unhappy              ROS:  ROS  Allergies  Allergen Reactions   Penicillins Other (See Comments)    PT SAID HE PASSED OUT MANY YEARS AGO FROM PENICILLIN    Past Medical History:  Diagnosis Date   Medical history non-contributory    Red eye    left eye    Past Surgical History:  Procedure Laterality Date   COLONOSCOPY N/A 11/14/2014    Procedure: COLONOSCOPY;  Surgeon: Daneil Dolin, MD;  Location[facility];  Service: Endoscopy;  Laterality: N/A;  9.30 AM   TONSILLECTOMY      Social History   Socioeconomic History   Marital status: Single    Spouse name: Not on file   Number of children: Not on file   Years of education: Not on file   Highest education level: Not on file  Occupational History   Not on file  Tobacco Use   Smoking status: Never   Smokeless tobacco: Never  Substance and Sexual Activity   Alcohol use: Yes    Comment: 2 or 3 liquor on weekend   Drug use: Never   Sexual activity: Not on file  Other Topics Concern   Not on file  Social History Narrative   Not on file   Social Determinants of Health   Financial Resource Strain: Not on file  Food Insecurity:  Not on file  Transportation Needs: Not on file  Physical Activity: Not on file  Stress: Not on file  Social Connections: Not on file  Intimate Partner Violence: Not on file    History reviewed. No pertinent family history.  Anti-infectives: Anti-infectives (From admission, onward)    None       Current Outpatient Medications  Medication Sig Dispense Refill   tamsulosin (FLOMAX) 0.4 MG CAPS capsule Take 1 capsule (0.4 mg total) by mouth daily. 30 capsule 11   No current facility-administered medications for this visit.     Objective: Vital signs in last 24 hours: BP (!) 147/98   Pulse 66   Temp 98 F (36.7 C)   Ht 5\' 11"  (1.803 m)   Wt 137 lb 3.2 oz (62.2 kg)   BMI 19.14 kg/m   Intake/Output from previous day: No intake/output data recorded. Intake/Output this shift: @IOTHISSHIFT @   Physical Exam Vitals reviewed.  Constitutional:      Appearance: Normal appearance.  Cardiovascular:     Rate and Rhythm: Normal rate and regular rhythm.     Heart sounds: Normal heart sounds.  Pulmonary:     Effort: Pulmonary effort is normal. No respiratory distress.     Breath sounds: Normal breath sounds.   Abdominal:     General: Abdomen is flat.     Palpations: Abdomen is soft.     Hernia: No hernia is present.  Genitourinary:    Comments: Normal phallus with adequate meatus. Scrotum, testes and epididymis are normal [facility] without lesions. NST without mass. Prostate is 2.5+ without lesions. SV's normal. Musculoskeletal:        General: No swelling. Normal range of motion.     Cervical back: Normal range of motion and neck supple.  Skin:    General: Skin is warm and dry.  Neurological:     General: No focal deficit present.     Mental Status: He is alert and oriented to person, place, and time.  Psychiatric:        Mood and Affect: Mood normal.        Behavior: Behavior normal.    Lab Results:  Recent Results (from the past 2160 hour(s))  Hepatic function panel     Status: Abnormal   Collection Time: 05/03/21 12:55 PM  Result Value Ref Range   Total Protein 7.3 6.5 - 8.1 g/dL   Albumin 3.6 3.5 - 5.0 g/dL   AST 46 (H) 15 - 41 U/L   ALT 62 (H) 0 - 44 U/L   Alkaline Phosphatase 48 38 - 126 U/L   Total Bilirubin 0.4 0.3 - 1.2 mg/dL   Bilirubin, Direct 0.1 0.0 - 0.2 mg/dL   Indirect Bilirubin 0.3 0.3 - 0.9 mg/dL    Comment: Performed at [facility], [address] [place], [place] [address]  Basic metabolic panel     Status: Abnormal   Collection Time: 05/03/21 12:55 PM  Result Value Ref Range   Sodium 136 135 - 145 mmol/L   Potassium 3.9 3.5 - 5.1 mmol/L   Chloride 103 98 - 111 mmol/L   CO2 26 22 - 32 mmol/L   Glucose, Bld 96 70 - 99 mg/dL    Comment: Glucose reference range applies only to samples taken after fasting for at least 8 hours.   BUN 16 8 - 23 mg/dL   Creatinine, Ser 1.24 0.61 - 1.24 mg/dL   Calcium 8.8 (L) 8.9 - 10.3 mg/dL   GFR, Estimated >60 >60  mL/min    Comment: (NOTE) Calculated using the CKD-EPI Creatinine Equation (2021)    Anion gap 7 5 - 15    Comment: Performed at [facility], [address] [place], [place] [address]  CBC with  Differential/Platelet     Status: Abnormal   Collection Time: 05/03/21 12:55 PM  Result Value Ref Range   WBC 3.3 (L) 4.0 - 10.5 K/uL   RBC 4.68 4.22 - 5.81 MIL/uL   Hemoglobin 14.2 13.0 - 17.0 g/dL   HCT 42.7 39.0 - 52.0 %   MCV 91.2 80.0 - 100.0 fL   MCH 30.3 26.0 - 34.0 pg   MCHC 33.3 30.0 - 36.0 g/dL   RDW 13.6 11.5 - 15.5 %   Platelets 230 150 - 400 K/uL   nRBC 0.0 0.0 - 0.2 %   Neutrophils Relative % 51 %   Neutro Abs 1.7 1.7 - 7.7 K/uL   Lymphocytes Relative 39 %   Lymphs Abs 1.3 0.7 - 4.0 K/uL   Monocytes Relative 8 %   Monocytes Absolute 0.3 0.1 - 1.0 K/uL   Eosinophils Relative 1 %   Eosinophils Absolute 0.0 0.0 - 0.5 K/uL   Basophils Relative 1 %   Basophils Absolute 0.0 0.0 - 0.1 K/uL   Immature Granulocytes 0 %   Abs Immature Granulocytes 0.01 0.00 - 0.07 K/uL    Comment: Performed at [facility], [address], [place], [place] [address]  Lipid panel     Status: Abnormal   Collection Time: 05/03/21 12:56 PM  Result Value Ref Range   Cholesterol 186 0 - 200 mg/dL   Triglycerides 56 <150 mg/dL   HDL 65 >40 mg/dL   Total CHOL/HDL Ratio 2.9 RATIO   VLDL 11 0 - 40 mg/dL   LDL Cholesterol 110 (H) 0 - 99 mg/dL    Comment:        Total Cholesterol/HDL:CHD Risk Coronary Heart Disease Risk Table                     Men   Women  1/2 Average Risk   3.4   3.3  Average Risk       5.0   4.4  2 X Average Risk   9.6   7.1  3 X Average Risk  23.4   11.0        Use the calculated Patient Ratio above and the CHD Risk Table to determine the patient's CHD Risk.        ATP III CLASSIFICATION (LDL):  <100     mg/dL   Optimal  100-129  mg/dL   Near or Above                    Optimal  130-159  mg/dL   Borderline  160-189  mg/dL   High  >190     mg/dL   Very High Performed at [address] [place], [place] [address]   Hemoglobin A1c     Status: Abnormal   Collection Time: 05/03/21 12:56 PM  Result Value Ref Range   Hgb A1c MFr Bld 6.2 (H) 4.8 - 5.6 %     Comment: (NOTE) Pre diabetes:          5.7%-6.4%  Diabetes:              >6.4%  Glycemic control for   <7.0% adults with diabetes    Mean Plasma Glucose 131.24 mg/dL  Comment: Performed at [facility], [place] [address], [place], [place] [address]  PSA     Status: Abnormal   Collection Time: 05/03/21 12:56 PM  Result Value Ref Range   Prostatic Specific Antigen 4.94 (H) 0.00 - 4.00 ng/mL    Comment: (NOTE) While PSA levels of <=4.0 ng/ml are reported as reference range, some men with levels below 4.0 ng/ml can have prostate cancer and many men with PSA above 4.0 ng/ml do not have prostate cancer.  Other tests such as free PSA, age specific reference ranges, PSA velocity and PSA doubling time may be helpful especially in men less than 72 years old. Performed at [facility], [place] [address], [place], [place] [address]   Microalbumin / creatinine urine ratio     Status: Abnormal   Collection Time: 05/03/21 12:56 PM  Result Value Ref Range   Microalb, Ur 22.9 (H) Not Estab. ug/mL   Microalb Creat Ratio 12 0 - 29 mg/g creat    Comment: (NOTE)                       Normal:                0 -  29                       Moderately increased: 30 - 300                       Severely increased:       >300 Performed At: [facility] [address] [place], [place] [id number] Rush Farmer MD YW:[id number]    Creatinine, Urine 191.1 Not Estab. mg/dL  Urinalysis, Routine w reflex microscopic     Status: None   Collection Time: 07/26/21  9:13 AM  Result Value Ref Range   Specific Gravity, UA 1.025 1.005 - 1.030   pH, UA 5.5 5.0 - 7.5   Color, UA Yellow Yellow   Appearance Ur Clear Clear   Leukocytes,UA Negative Negative   Protein,UA Negative Negative/Trace   Glucose, UA Negative Negative   Ketones, UA Negative Negative   RBC, UA Negative Negative   Bilirubin, UA Negative Negative   Urobilinogen, Ur 0.2 0.2 - 1.0 mg/dL   Nitrite, UA Negative Negative    Microscopic Examination Comment     Comment: Microscopic follows if indicated.  Bladder Scan (Post Void Residual) in office     Status: None   Collection Time: 07/26/21  9:42 AM  Result Value Ref Range   Scan Result 29      BMET No results for input(s): NA, K, CL, CO2, GLUCOSE, BUN, CREATININE, CALCIUM in the last 72 hours. PT/INR No results for input(s): LABPROT, INR in the last 72 hours. ABG No results for input(s): PHART, HCO3 in the last 72 hours.  Invalid input(s): PCO2, PO2  Studies/Results: No results found.   Assessment/Plan: Elevated PSA.  His PSA is 4.94 with an enlarged prostate but benign exam.   I will repeat the PSA and if the level remains up, I will get him set up for a prostate [place] and biopsy.  I have reviewed the risks of bleeding, infection and voiding difficulty.  BPH with BOO and LUTS.   I am going to try him on tamsulosin and reviewed the side effects.  He will return in 4-6 weeks for a flowrate.    Meds ordered this encounter  Medications   tamsulosin (FLOMAX) 0.4 MG CAPS capsule    Sig: Take 1 capsule (0.4 mg total) by mouth daily.    Dispense:  30 capsule    Refill:  11      Orders Placed This Encounter  Procedures   Urinalysis, Routine w reflex microscopic   PSA, total and free   Bladder Scan (Post Void Residual) in office     Return in about 4 weeks (around 08/23/2021) for 4-6 weeks for flowrate. .    CC: Dr. Rosita Fire.      Irine Seal 07/26/2021 [phone]

## 2021-07-26 NOTE — Progress Notes (Signed)
post void residual=29 

## 2021-07-27 LAB — PSA, TOTAL AND FREE
PSA, Free Pct: 20.5 %
PSA, Free: 1.21 ng/mL
Prostate Specific Ag, Serum: 5.9 ng/mL — ABNORMAL HIGH (ref 0.0–4.0)

## 2021-08-03 ENCOUNTER — Telehealth: Payer: Self-pay

## 2021-08-03 NOTE — Telephone Encounter (Signed)
Tried to call pt lvm for pt call us back regarding lab results.

## 2021-08-03 NOTE — Telephone Encounter (Signed)
-----   Message from Irine Seal, MD sent at 07/27/2021  2:13 PM EDT ----- His PSA is up further to 5.9.  he needs to be set up for a prostate [place] and biopsy if not scheduled already and he will need Levaquin 750mg , I don't recall if I gave him that or not.  ----- Message ----- From: Dorisann Frames, RN Sent: 07/27/2021   8:51 AM EDT To: Irine Seal, MD  Please review

## 2021-08-07 ENCOUNTER — Telehealth: Payer: Self-pay

## 2021-08-07 NOTE — Telephone Encounter (Signed)
See other task

## 2021-08-07 NOTE — Telephone Encounter (Signed)
Patient returned the call and left a voice mail message.  Please call patient to advise.

## 2021-08-07 NOTE — Telephone Encounter (Signed)
I called patient and gave results.  Patient states he will have to wait for the first of the year to proceed with prostate biopsy.  Patient stated he would call back after the new year to schedule.

## 2021-08-07 NOTE — Telephone Encounter (Signed)
-----   Message from Irine Seal, MD sent at 07/27/2021  2:13 PM EDT ----- His PSA is up further to 5.9.  he needs to be set up for a prostate [place] and biopsy if not scheduled already and he will need Levaquin 750mg , I don't recall if I gave him that or not.  ----- Message ----- From: Dorisann Frames, RN Sent: 07/27/2021   8:51 AM EDT To: Irine Seal, MD  Please review

## 2021-08-09 ENCOUNTER — Ambulatory Visit: Payer: Self-pay | Admitting: Urology

## 2021-09-06 ENCOUNTER — Ambulatory Visit (INDEPENDENT_AMBULATORY_CARE_PROVIDER_SITE_OTHER): Payer: BLUE CROSS/BLUE SHIELD | Admitting: Urology

## 2021-09-06 ENCOUNTER — Encounter: Payer: Self-pay | Admitting: Urology

## 2021-09-06 ENCOUNTER — Other Ambulatory Visit: Payer: Self-pay

## 2021-09-06 VITALS — BP 169/102 | HR 64 | Temp 97.7°F

## 2021-09-06 DIAGNOSIS — R972 Elevated prostate specific antigen [PSA]: Secondary | ICD-10-CM | POA: Diagnosis not present

## 2021-09-06 DIAGNOSIS — N401 Enlarged prostate with lower urinary tract symptoms: Secondary | ICD-10-CM

## 2021-09-06 DIAGNOSIS — R3915 Urgency of urination: Secondary | ICD-10-CM

## 2021-09-06 DIAGNOSIS — R350 Frequency of micturition: Secondary | ICD-10-CM | POA: Diagnosis not present

## 2021-09-06 DIAGNOSIS — R351 Nocturia: Secondary | ICD-10-CM

## 2021-09-06 DIAGNOSIS — N138 Other obstructive and reflux uropathy: Secondary | ICD-10-CM

## 2021-09-06 DIAGNOSIS — R3911 Hesitancy of micturition: Secondary | ICD-10-CM

## 2021-09-06 DIAGNOSIS — R3912 Poor urinary stream: Secondary | ICD-10-CM

## 2021-09-06 LAB — MICROSCOPIC EXAMINATION
Bacteria, UA: NONE SEEN
Epithelial Cells (non renal): NONE SEEN /hpf (ref 0–10)
Renal Epithel, UA: NONE SEEN /hpf
WBC, UA: NONE SEEN /hpf (ref 0–5)

## 2021-09-06 LAB — URINALYSIS, ROUTINE W REFLEX MICROSCOPIC
Bilirubin, UA: NEGATIVE
Glucose, UA: NEGATIVE
Ketones, UA: NEGATIVE
Leukocytes,UA: NEGATIVE
Nitrite, UA: NEGATIVE
Specific Gravity, UA: 1.02 (ref 1.005–1.030)
Urobilinogen, Ur: 0.2 mg/dL (ref 0.2–1.0)
pH, UA: 6 (ref 5.0–7.5)

## 2021-09-06 MED ORDER — TAMSULOSIN HCL 0.4 MG PO CAPS: 0.4000 mg | ORAL_CAPSULE | Freq: Every day | ORAL | 11 refills | Status: AC

## 2021-09-06 MED ORDER — LEVOFLOXACIN 750 MG PO TABS: ORAL_TABLET | ORAL | 0 refills | Status: AC

## 2021-09-06 NOTE — Progress Notes (Signed)
Urological Symptom Review  Patient is experiencing the following symptoms: Frequent urination Hard to postpone urination Get up at night to urinate Stream starts and stops Trouble starting stream Have to strain to urinate Weak stream Erection problems (male only)   Review of Systems  Gastrointestinal (upper)  : Negative for upper GI symptoms  Gastrointestinal (lower) : Negative for lower GI symptoms  Constitutional : Negative for symptoms  Skin: Negative for skin symptoms  Eyes: Negative for eye symptoms  Ear/Nose/Throat : Negative for Ear/Nose/Throat symptoms  Hematologic/Lymphatic: Negative for Hematologic/Lymphatic symptoms  Cardiovascular : Negative for cardiovascular symptoms  Respiratory : Negative for respiratory symptoms  Endocrine: Negative for endocrine symptoms  Musculoskeletal: Negative for musculoskeletal symptoms  Neurological: Negative for neurological symptoms  Psychologic: Negative for psychiatric symptoms

## 2021-09-06 NOTE — Progress Notes (Signed)
Subjective: 1. Elevated PSA   2. BPH with urinary obstruction   3. Urinary frequency   4. Nocturia   5. Urgency of urination   6. Hesitancy   7. Weak urinary stream      Consult requested by Dr. Elon Jester.  Ryan Frost is a 65 yo male who is sent by Dr. Legrand Rams for an elevated PSA of 4.94 and a voiding symptoms that are long standing and stable with frequency q2hrs and nocturia x 4 with a reduced stream, urgency and hesitancy.  His IPSS is 24.   He has had no hematuria or dysuria.  He has no prior GU history.   He has no family history of prostate cancer.   He has some issues with ED with difficulty obtaining and maintaining a erection.  His UA is clear and his PVR is 50ml.   09/06/21: Ryan Frost returns today in f/u.  A repeat PSA on 9/29 was up further to 5.9 with a 20.5% f/t ratio.  He never took the tamsulosin and his IPSS is 27.  A PF with 67ml voided today was 57ml/sec.   He was called to set up a prostate biopsy but wanted to wait until the first of next year.     IPSS     Row Name 09/06/21 1000         International Prostate Symptom Score   How often have you had the sensation of not emptying your bladder? Less than 1 in 5     How often have you had to urinate less than every two hours? Almost always     How often have you found you stopped and started again several times when you urinated? Almost always     How often have you found it difficult to postpone urination? Almost always     How often have you had a weak urinary stream? About half the time     How often have you had to strain to start urination? About half the time     How many times did you typically get up at night to urinate? 5 Times     Total IPSS Score 27       Quality of Life due to urinary symptoms   If you were to spend the rest of your life with your urinary condition just the way it is now how would you feel about that? Terrible              IPSS     Row Name 09/06/21 1000         International  Prostate Symptom Score   How often have you had the sensation of not emptying your bladder? Less than 1 in 5     How often have you had to urinate less than every two hours? Almost always     How often have you found you stopped and started again several times when you urinated? Almost always     How often have you found it difficult to postpone urination? Almost always     How often have you had a weak urinary stream? About half the time     How often have you had to strain to start urination? About half the time     How many times did you typically get up at night to urinate? 5 Times     Total IPSS Score 27       Quality of Life due to urinary symptoms   If you  were to spend the rest of your life with your urinary condition just the way it is now how would you feel about that? Terrible               ROS:  ROS  Allergies  Allergen Reactions   Penicillins Other (See Comments)    PT SAID HE PASSED OUT MANY YEARS AGO FROM PENICILLIN    Past Medical History:  Diagnosis Date   Medical history non-contributory    Red eye    left eye    Past Surgical History:  Procedure Laterality Date   COLONOSCOPY N/A 11/14/2014   Procedure: COLONOSCOPY;  Surgeon: Daneil Dolin, MD;  [facility];  Service: Endoscopy;  Laterality: N/A;  9.30 AM   TONSILLECTOMY      Social History   Socioeconomic History   Marital status: Single    Spouse name: Not on file   Number of children: Not on file   Years of education: Not on file   Highest education level: Not on file  Occupational History   Not on file  Tobacco Use   Smoking status: Never   Smokeless tobacco: Never  Substance and Sexual Activity   Alcohol use: Yes    Comment: 2 or 3 liquor on weekend   Drug use: Never   Sexual activity: Not on file  Other Topics Concern   Not on file  Social History Narrative   Not on file   Social Determinants of Health   Financial Resource Strain: Not on file  Food Insecurity:  Not on file  Transportation Needs: Not on file  Physical Activity: Not on file  Stress: Not on file  Social Connections: Not on file  Intimate Partner Violence: Not on file    No family history on file.  Anti-infectives: Anti-infectives (From admission, onward)    Start     Dose/Rate Route Frequency Ordered Stop   09/06/21 0000  levofloxacin (LEVAQUIN) 750 MG tablet           09/06/21 0076         Current Outpatient Medications  Medication Sig Dispense Refill   levofloxacin (LEVAQUIN) 750 MG tablet Take 1 tablet 1 hour prior to the prostate biopsy. 1 tablet 0   tamsulosin (FLOMAX) 0.4 MG CAPS capsule Take 1 capsule (0.4 mg total) by mouth daily. 30 capsule 11   No current facility-administered medications for this visit.     Objective: Vital signs in last 24 hours: BP (!) 169/102   Pulse 64   Temp 97.7 F (36.5 C)   Intake/Output from previous day: No intake/output data recorded. Intake/Output this shift: @IOTHISSHIFT @   Physical Exam  Lab Results:  Recent Results (from the past 2160 hour(s))  Urinalysis, Routine w reflex microscopic     Status: None   Collection Time: 07/26/21  9:13 AM  Result Value Ref Range   Specific Gravity, UA 1.025 1.005 - 1.030   pH, UA 5.5 5.0 - 7.5   Color, UA Yellow Yellow   Appearance Ur Clear Clear   Leukocytes,UA Negative Negative   Protein,UA Negative Negative/Trace   Glucose, UA Negative Negative   Ketones, UA Negative Negative   RBC, UA Negative Negative   Bilirubin, UA Negative Negative   Urobilinogen, Ur 0.2 0.2 - 1.0 mg/dL   Nitrite, UA Negative Negative   Microscopic Examination Comment     Comment: Microscopic follows if indicated.  Bladder Scan (Post Void Residual) in office     Status:  None   Collection Time: 07/26/21  9:42 AM  Result Value Ref Range   Scan Result 29   PSA, total and free     Status: Abnormal   Collection Time: 07/26/21  1:15 PM  Result Value Ref Range   Prostate Specific Ag, Serum 5.9 (H)  0.0 - 4.0 ng/mL    Comment: Roche ECLIA methodology. According to the American Urological Association, Serum PSA should decrease and remain at undetectable levels after radical prostatectomy. The AUA defines biochemical recurrence as an initial PSA value 0.2 ng/mL or greater followed by a subsequent confirmatory PSA value 0.2 ng/mL or greater. Values obtained with different assay methods or kits cannot be used interchangeably. Results cannot be interpreted as absolute evidence of the presence or absence of malignant disease.    PSA, Free 1.21 N/A ng/mL    Comment: Roche ECLIA methodology.   PSA, Free Pct 20.5 %    Comment: The table below lists the probability of prostate cancer for men with non-suspicious DRE results and total PSA between 4 and 10 ng/mL, by patient age Ricci Barker, [place], [id number]).                   % Free PSA       50-64 yr        65-75 yr                   0.00-10.00%        56%             55%                  10.01-15.00%        24%             35%                  15.01-20.00%        17%             23%                  20.01-25.00%        10%             20%                       >25.00%         5%              9% Please note:  Catalona et al did not make specific               recommendations regarding the use of               percent free PSA for any other population               of men.   Urinalysis, Routine w reflex microscopic     Status: Abnormal   Collection Time: 09/06/21  9:42 AM  Result Value Ref Range   Specific Gravity, UA 1.020 1.005 - 1.030   pH, UA 6.0 5.0 - 7.5   Color, UA Yellow Yellow   Appearance Ur Clear Clear   Leukocytes,UA Negative Negative   Protein,UA Trace (A) Negative/Trace   Glucose, UA Negative Negative   Ketones, UA Negative Negative   RBC, UA Trace (A) Negative   Bilirubin, UA Negative Negative   Urobilinogen, Ur 0.2 0.2 - 1.0 mg/dL  Nitrite, UA Negative Negative   Microscopic Examination See below:    Microscopic Examination     Status: None   Collection Time: 09/06/21  9:42 AM   Urine  Result Value Ref Range   WBC, UA None seen 0 - 5 /hpf   RBC 0-2 0 - 2 /hpf   Epithelial Cells (non renal) None seen 0 - 10 /hpf   Renal Epithel, UA None seen None seen /hpf   Mucus, UA Present Not Estab.   Bacteria, UA None seen None seen/Few     BMET No results for input(s): NA, K, CL, CO2, GLUCOSE, BUN, CREATININE, CALCIUM in the last 72 hours. PT/INR No results for input(s): LABPROT, INR in the last 72 hours. ABG No results for input(s): PHART, HCO3 in the last 72 hours.  Invalid input(s): PCO2, PO2  Studies/Results: No results found.   Assessment/Plan: Elevated PSA.  His PSA is up to 5.9 from 4.94 with an enlarged prostate but benign exam.   I will get him set up for a prostate [place] and biopsy.  I have reviewed the risks of bleeding, infection and voiding difficulty.  BPH with BOO and LUTS.   I am going to resent the tamsulosin and reviewed the side effects.    Meds ordered this encounter  Medications   tamsulosin (FLOMAX) 0.4 MG CAPS capsule    Sig: Take 1 capsule (0.4 mg total) by mouth daily.    Dispense:  30 capsule    Refill:  11   levofloxacin (LEVAQUIN) 750 MG tablet    Sig: Take 1 tablet 1 hour prior to the prostate biopsy.    Dispense:  1 tablet    Refill:  0       Orders Placed This Encounter  Procedures   Microscopic Examination   [place] PROSTATE BIOPSY MULTIPLE    Standing Status:   Future    Standing Expiration Date:   01/04/2022    Scheduling Instructions:     Next available time after Thanksgiving.    Order Specific Question:   Reason for Exam (SYMPTOM  OR DIAGNOSIS REQUIRED)    Answer:   elevated PSA    Order Specific Question:   Preferred location?    Answer:   [facility]   US Guided Needle Placement    Standing Status:   Future    Standing Expiration Date:   09/06/2022    Order Specific Question:   Reason for Exam (SYMPTOM  OR DIAGNOSIS REQUIRED)     Answer:   elevated psa    Order Specific Question:   Preferred imaging location?    Answer:   [facility]   [place] Transrectal Complete    Standing Status:   Future    Standing Expiration Date:   09/06/2022    Order Specific Question:   Reason for Exam (SYMPTOM  OR DIAGNOSIS REQUIRED)    Answer:   elevated psa    Order Specific Question:   Preferred imaging location?    Answer:   [facility]   Urinalysis, Routine w reflex microscopic     Return for Next available with biopsy results. .    CC: Dr. Rosita Fire.      Irine Seal 09/06/2021 [phone] Patient ID: Ryan Frost, male   DOB: November 13, 1955, 65 y.o.   MRN: [id number]

## 2021-09-06 NOTE — Progress Notes (Signed)
Uroflow  Peak Flow: 64ml Average Flow: 39ml Voided Volume: 83ml Voiding Time: 10sec Flow Time: 10sec Time to Peak Flow: 2sec

## 2021-09-06 NOTE — Patient Instructions (Addendum)
   Appointment Time: arrive 8am  Appointment Date: December 15th  Location: [facility] Radiology Department   Prostate Biopsy Instructions  Stop all aspirin or blood thinners (aspirin, plavix, coumadin, warfarin, motrin, ibuprofen, advil, aleve, naproxen, naprosyn) for 7 days prior to the procedure.  If you have any questions about stopping these medications, please contact your primary care physician or cardiologist.  Having a light meal prior to the procedure is recommended.  If you are diabetic or have low blood sugar please bring a small snack or glucose tablet.  A Fleets enema is needed to be purchased over the counter at a local pharmacy and used 2 hours before you scheduled appointment.  This can be purchased over the counter at any pharmacy.  Antibiotics will be administered in the clinic at the time of the procedure and 1 tablet has been sent to your pharmacy. Please take the antibiotic as prescribed.    Please bring someone with you to the procedure to drive you home if you are given a valium to take prior to your procedure.   If you have any questions or concerns, please feel free to call the office at [phone] or send a Mychart message.    Thank you, [facility]

## 2021-10-10 ENCOUNTER — Telehealth: Payer: Self-pay

## 2021-10-10 NOTE — Telephone Encounter (Signed)
Patient had to reschedule prostate biopsy, he was rescheduled for 11/01/2021.

## 2021-10-11 ENCOUNTER — Ambulatory Visit (HOSPITAL_COMMUNITY): Payer: BLUE CROSS/BLUE SHIELD

## 2021-10-11 ENCOUNTER — Encounter (HOSPITAL_COMMUNITY): Payer: Self-pay

## 2021-10-11 ENCOUNTER — Other Ambulatory Visit: Payer: BLUE CROSS/BLUE SHIELD | Admitting: Urology

## 2021-11-01 ENCOUNTER — Ambulatory Visit (HOSPITAL_COMMUNITY): Payer: BLUE CROSS/BLUE SHIELD

## 2021-11-01 ENCOUNTER — Other Ambulatory Visit: Payer: BLUE CROSS/BLUE SHIELD | Admitting: Urology

## 2021-11-06 ENCOUNTER — Telehealth: Payer: Self-pay

## 2021-11-06 NOTE — Telephone Encounter (Signed)
Pt left a voice message:  Wanting to ask some questions regarding biopsy on Thurs.  Please advise.  Call back:  [phone]  Thanks, Ryan Frost

## 2021-11-07 NOTE — Telephone Encounter (Signed)
Patient calling today.  He has started with a cough and cold symptoms. Wants to reschedule biopsy with [facility] on Thurs.  11-08-21  Thanks, Helene Kelp

## 2021-11-07 NOTE — Telephone Encounter (Signed)
Returned called to patient. New biopsy date given to patient for 01/26 and patient agreed to date/time

## 2021-11-08 ENCOUNTER — Ambulatory Visit (HOSPITAL_COMMUNITY): Payer: BLUE CROSS/BLUE SHIELD

## 2021-11-08 ENCOUNTER — Other Ambulatory Visit: Payer: BLUE CROSS/BLUE SHIELD | Admitting: Urology

## 2021-11-20 ENCOUNTER — Telehealth: Payer: Self-pay

## 2021-11-20 NOTE — Telephone Encounter (Signed)
Per message from scheduling and preservice- patient wishes to cancel prostate biopsy and not reschedule.  Message sent to MD

## 2021-11-22 ENCOUNTER — Other Ambulatory Visit: Payer: BLUE CROSS/BLUE SHIELD | Admitting: Urology

## 2021-11-22 ENCOUNTER — Ambulatory Visit (HOSPITAL_COMMUNITY): Payer: BLUE CROSS/BLUE SHIELD

## 2021-11-23 NOTE — Telephone Encounter (Signed)
Letter sent to PCP

## 2022-10-30 DIAGNOSIS — N401 Enlarged prostate with lower urinary tract symptoms: Secondary | ICD-10-CM | POA: Diagnosis not present

## 2022-11-13 DIAGNOSIS — R972 Elevated prostate specific antigen [PSA]: Secondary | ICD-10-CM | POA: Diagnosis not present

## 2022-12-02 DIAGNOSIS — R3912 Poor urinary stream: Secondary | ICD-10-CM | POA: Diagnosis not present

## 2022-12-02 DIAGNOSIS — R972 Elevated prostate specific antigen [PSA]: Secondary | ICD-10-CM | POA: Diagnosis not present

## 2022-12-02 DIAGNOSIS — N401 Enlarged prostate with lower urinary tract symptoms: Secondary | ICD-10-CM | POA: Diagnosis not present

## 2022-12-12 DIAGNOSIS — M25552 Pain in left hip: Secondary | ICD-10-CM | POA: Diagnosis not present

## 2022-12-12 DIAGNOSIS — R0981 Nasal congestion: Secondary | ICD-10-CM | POA: Diagnosis not present

## 2022-12-12 DIAGNOSIS — M25551 Pain in right hip: Secondary | ICD-10-CM | POA: Diagnosis not present

## 2022-12-26 DIAGNOSIS — M5416 Radiculopathy, lumbar region: Secondary | ICD-10-CM | POA: Diagnosis not present

## 2023-01-09 DIAGNOSIS — M5416 Radiculopathy, lumbar region: Secondary | ICD-10-CM | POA: Diagnosis not present

## 2023-01-23 DIAGNOSIS — M4800 Spinal stenosis, site unspecified: Secondary | ICD-10-CM | POA: Diagnosis not present

## 2023-01-23 DIAGNOSIS — M5416 Radiculopathy, lumbar region: Secondary | ICD-10-CM | POA: Diagnosis not present

## 2023-02-05 DIAGNOSIS — M5416 Radiculopathy, lumbar region: Secondary | ICD-10-CM | POA: Diagnosis not present

## 2023-02-05 DIAGNOSIS — M5136 Other intervertebral disc degeneration, lumbar region: Secondary | ICD-10-CM | POA: Diagnosis not present

## 2023-02-05 DIAGNOSIS — M7918 Myalgia, other site: Secondary | ICD-10-CM | POA: Diagnosis not present

## 2023-02-05 DIAGNOSIS — M4800 Spinal stenosis, site unspecified: Secondary | ICD-10-CM | POA: Diagnosis not present

## 2023-02-10 DIAGNOSIS — N182 Chronic kidney disease, stage 2 (mild): Secondary | ICD-10-CM | POA: Diagnosis not present

## 2023-02-10 DIAGNOSIS — R7303 Prediabetes: Secondary | ICD-10-CM | POA: Diagnosis not present

## 2023-02-10 DIAGNOSIS — R5381 Other malaise: Secondary | ICD-10-CM | POA: Diagnosis not present

## 2023-02-10 DIAGNOSIS — I10 Essential (primary) hypertension: Secondary | ICD-10-CM | POA: Diagnosis not present

## 2023-05-22 DIAGNOSIS — N182 Chronic kidney disease, stage 2 (mild): Secondary | ICD-10-CM | POA: Diagnosis not present

## 2023-05-22 DIAGNOSIS — R5381 Other malaise: Secondary | ICD-10-CM | POA: Diagnosis not present

## 2023-05-22 DIAGNOSIS — I10 Essential (primary) hypertension: Secondary | ICD-10-CM | POA: Diagnosis not present

## 2023-05-22 DIAGNOSIS — N400 Enlarged prostate without lower urinary tract symptoms: Secondary | ICD-10-CM | POA: Diagnosis not present

## 2023-05-23 DIAGNOSIS — N400 Enlarged prostate without lower urinary tract symptoms: Secondary | ICD-10-CM | POA: Diagnosis not present

## 2023-05-23 DIAGNOSIS — N182 Chronic kidney disease, stage 2 (mild): Secondary | ICD-10-CM | POA: Diagnosis not present

## 2023-05-23 DIAGNOSIS — I10 Essential (primary) hypertension: Secondary | ICD-10-CM | POA: Diagnosis not present

## 2023-08-11 DIAGNOSIS — N182 Chronic kidney disease, stage 2 (mild): Secondary | ICD-10-CM | POA: Diagnosis not present

## 2023-08-11 DIAGNOSIS — I10 Essential (primary) hypertension: Secondary | ICD-10-CM | POA: Diagnosis not present

## 2023-08-11 DIAGNOSIS — Z0001 Encounter for general adult medical examination with abnormal findings: Secondary | ICD-10-CM | POA: Diagnosis not present

## 2023-08-11 DIAGNOSIS — R5381 Other malaise: Secondary | ICD-10-CM | POA: Diagnosis not present

## 2023-08-11 DIAGNOSIS — Z1389 Encounter for screening for other disorder: Secondary | ICD-10-CM | POA: Diagnosis not present

## 2023-08-11 DIAGNOSIS — Z1331 Encounter for screening for depression: Secondary | ICD-10-CM | POA: Diagnosis not present

## 2023-08-11 DIAGNOSIS — R7303 Prediabetes: Secondary | ICD-10-CM | POA: Diagnosis not present

## 2023-08-11 DIAGNOSIS — N400 Enlarged prostate without lower urinary tract symptoms: Secondary | ICD-10-CM | POA: Diagnosis not present

## 2023-11-10 DIAGNOSIS — I10 Essential (primary) hypertension: Secondary | ICD-10-CM | POA: Diagnosis not present

## 2023-11-10 DIAGNOSIS — N182 Chronic kidney disease, stage 2 (mild): Secondary | ICD-10-CM | POA: Diagnosis not present

## 2023-11-14 DIAGNOSIS — I10 Essential (primary) hypertension: Secondary | ICD-10-CM | POA: Diagnosis not present

## 2023-11-19 DIAGNOSIS — N182 Chronic kidney disease, stage 2 (mild): Secondary | ICD-10-CM | POA: Diagnosis not present

## 2023-11-19 DIAGNOSIS — N400 Enlarged prostate without lower urinary tract symptoms: Secondary | ICD-10-CM | POA: Diagnosis not present

## 2023-11-19 DIAGNOSIS — I10 Essential (primary) hypertension: Secondary | ICD-10-CM | POA: Diagnosis not present

## 2023-11-27 DIAGNOSIS — R972 Elevated prostate specific antigen [PSA]: Secondary | ICD-10-CM | POA: Diagnosis not present

## 2023-12-04 DIAGNOSIS — R972 Elevated prostate specific antigen [PSA]: Secondary | ICD-10-CM | POA: Diagnosis not present

## 2023-12-04 DIAGNOSIS — R3915 Urgency of urination: Secondary | ICD-10-CM | POA: Diagnosis not present

## 2023-12-04 DIAGNOSIS — N401 Enlarged prostate with lower urinary tract symptoms: Secondary | ICD-10-CM | POA: Diagnosis not present

## 2023-12-04 DIAGNOSIS — R351 Nocturia: Secondary | ICD-10-CM | POA: Diagnosis not present

## 2024-01-23 DIAGNOSIS — I10 Essential (primary) hypertension: Secondary | ICD-10-CM | POA: Diagnosis not present

## 2024-01-23 DIAGNOSIS — N182 Chronic kidney disease, stage 2 (mild): Secondary | ICD-10-CM | POA: Diagnosis not present

## 2024-01-23 DIAGNOSIS — R7303 Prediabetes: Secondary | ICD-10-CM | POA: Diagnosis not present

## 2024-01-23 DIAGNOSIS — N400 Enlarged prostate without lower urinary tract symptoms: Secondary | ICD-10-CM | POA: Diagnosis not present

## 2024-01-23 DIAGNOSIS — M2550 Pain in unspecified joint: Secondary | ICD-10-CM | POA: Diagnosis not present

## 2024-02-23 DIAGNOSIS — I10 Essential (primary) hypertension: Secondary | ICD-10-CM | POA: Diagnosis not present

## 2024-02-23 DIAGNOSIS — N182 Chronic kidney disease, stage 2 (mild): Secondary | ICD-10-CM | POA: Diagnosis not present

## 2024-03-24 DIAGNOSIS — N182 Chronic kidney disease, stage 2 (mild): Secondary | ICD-10-CM | POA: Diagnosis not present

## 2024-03-24 DIAGNOSIS — I10 Essential (primary) hypertension: Secondary | ICD-10-CM | POA: Diagnosis not present

## 2024-04-24 DIAGNOSIS — N182 Chronic kidney disease, stage 2 (mild): Secondary | ICD-10-CM | POA: Diagnosis not present

## 2024-04-24 DIAGNOSIS — I10 Essential (primary) hypertension: Secondary | ICD-10-CM | POA: Diagnosis not present

## 2024-05-24 DIAGNOSIS — I10 Essential (primary) hypertension: Secondary | ICD-10-CM | POA: Diagnosis not present

## 2024-05-24 DIAGNOSIS — N182 Chronic kidney disease, stage 2 (mild): Secondary | ICD-10-CM | POA: Diagnosis not present

## 2024-05-26 DIAGNOSIS — R972 Elevated prostate specific antigen [PSA]: Secondary | ICD-10-CM | POA: Diagnosis not present

## 2024-06-02 DIAGNOSIS — N401 Enlarged prostate with lower urinary tract symptoms: Secondary | ICD-10-CM | POA: Diagnosis not present

## 2024-06-02 DIAGNOSIS — R972 Elevated prostate specific antigen [PSA]: Secondary | ICD-10-CM | POA: Diagnosis not present

## 2024-06-24 DIAGNOSIS — N182 Chronic kidney disease, stage 2 (mild): Secondary | ICD-10-CM | POA: Diagnosis not present

## 2024-06-24 DIAGNOSIS — I10 Essential (primary) hypertension: Secondary | ICD-10-CM | POA: Diagnosis not present

## 2024-07-25 DIAGNOSIS — N182 Chronic kidney disease, stage 2 (mild): Secondary | ICD-10-CM | POA: Diagnosis not present

## 2024-07-25 DIAGNOSIS — I10 Essential (primary) hypertension: Secondary | ICD-10-CM | POA: Diagnosis not present

## 2024-08-02 DIAGNOSIS — N182 Chronic kidney disease, stage 2 (mild): Secondary | ICD-10-CM | POA: Diagnosis not present

## 2024-08-02 DIAGNOSIS — Z1389 Encounter for screening for other disorder: Secondary | ICD-10-CM | POA: Diagnosis not present

## 2024-08-02 DIAGNOSIS — N400 Enlarged prostate without lower urinary tract symptoms: Secondary | ICD-10-CM | POA: Diagnosis not present

## 2024-08-02 DIAGNOSIS — R7303 Prediabetes: Secondary | ICD-10-CM | POA: Diagnosis not present

## 2024-08-02 DIAGNOSIS — Z0001 Encounter for general adult medical examination with abnormal findings: Secondary | ICD-10-CM | POA: Diagnosis not present

## 2024-08-02 DIAGNOSIS — I10 Essential (primary) hypertension: Secondary | ICD-10-CM | POA: Diagnosis not present

## 2024-08-02 DIAGNOSIS — Z1331 Encounter for screening for depression: Secondary | ICD-10-CM | POA: Diagnosis not present

## 2024-08-13 ENCOUNTER — Other Ambulatory Visit (HOSPITAL_COMMUNITY)
Admission: RE | Admit: 2024-08-13 | Discharge: 2024-08-13 | Disposition: A | Source: Ambulatory Visit | Attending: Internal Medicine | Admitting: Internal Medicine

## 2024-08-13 ENCOUNTER — Ambulatory Visit (HOSPITAL_COMMUNITY)
Admission: RE | Admit: 2024-08-13 | Discharge: 2024-08-13 | Disposition: A | Discharge status: Home / Self Care | Source: Ambulatory Visit | Attending: Internal Medicine | Admitting: Internal Medicine

## 2024-08-13 ENCOUNTER — Other Ambulatory Visit (HOSPITAL_COMMUNITY): Payer: Self-pay | Admitting: Internal Medicine

## 2024-08-13 DIAGNOSIS — R52 Pain, unspecified: Secondary | ICD-10-CM

## 2024-08-13 DIAGNOSIS — R7303 Prediabetes: Secondary | ICD-10-CM | POA: Insufficient documentation

## 2024-08-13 DIAGNOSIS — N400 Enlarged prostate without lower urinary tract symptoms: Secondary | ICD-10-CM | POA: Diagnosis not present

## 2024-08-13 DIAGNOSIS — N182 Chronic kidney disease, stage 2 (mild): Secondary | ICD-10-CM | POA: Insufficient documentation

## 2024-08-13 DIAGNOSIS — M47816 Spondylosis without myelopathy or radiculopathy, lumbar region: Secondary | ICD-10-CM | POA: Diagnosis not present

## 2024-08-13 DIAGNOSIS — I10 Essential (primary) hypertension: Secondary | ICD-10-CM | POA: Diagnosis present

## 2024-08-13 DIAGNOSIS — I129 Hypertensive chronic kidney disease with stage 1 through stage 4 chronic kidney disease, or unspecified chronic kidney disease: Secondary | ICD-10-CM | POA: Diagnosis not present

## 2024-08-13 LAB — CBC WITH DIFFERENTIAL/PLATELET
Abs Immature Granulocytes: 0.01 K/uL (ref 0.00–0.07)
Basophils Absolute: 0 K/uL (ref 0.0–0.1)
Basophils Relative: 0 %
Eosinophils Absolute: 0 K/uL (ref 0.0–0.5)
Eosinophils Relative: 1 %
HCT: 45.1 % (ref 39.0–52.0)
Hemoglobin: 14.7 g/dL (ref 13.0–17.0)
Immature Granulocytes: 0 %
Lymphocytes Relative: 38 %
Lymphs Abs: 1.4 K/uL (ref 0.7–4.0)
MCH: 28.9 pg (ref 26.0–34.0)
MCHC: 32.6 g/dL (ref 30.0–36.0)
MCV: 88.8 fL (ref 80.0–100.0)
Monocytes Absolute: 0.4 K/uL (ref 0.1–1.0)
Monocytes Relative: 10 %
Neutro Abs: 1.8 K/uL (ref 1.7–7.7)
Neutrophils Relative %: 51 %
Platelets: 257 K/uL (ref 150–400)
RBC: 5.08 MIL/uL (ref 4.22–5.81)
RDW: 14 % (ref 11.5–15.5)
WBC: 3.6 K/uL — ABNORMAL LOW (ref 4.0–10.5)
nRBC: 0 % (ref 0.0–0.2)

## 2024-08-13 LAB — LIPID PANEL
Cholesterol: 217 mg/dL — ABNORMAL HIGH (ref 0–200)
HDL: 64 mg/dL (ref >40)
LDL Cholesterol: 139 mg/dL — ABNORMAL HIGH (ref 0–99)
Total CHOL/HDL Ratio: 3.4 ratio
Triglycerides: 70 mg/dL (ref <150)
VLDL: 14 mg/dL (ref 0–40)

## 2024-08-13 LAB — HEPATIC FUNCTION PANEL
ALT: 19 U/L (ref 0–44)
AST: 26 U/L (ref 15–41)
Albumin: 4.3 g/dL (ref 3.5–5.0)
Alkaline Phosphatase: 53 U/L (ref 38–126)
Bilirubin, Direct: 0.1 mg/dL (ref 0.0–0.2)
Indirect Bilirubin: 0.2 mg/dL — ABNORMAL LOW (ref 0.3–0.9)
Total Bilirubin: 0.4 mg/dL (ref 0.0–1.2)
Total Protein: 7.7 g/dL (ref 6.5–8.1)

## 2024-08-13 LAB — BASIC METABOLIC PANEL WITH GFR
Anion gap: 9 (ref 5–15)
BUN: 16 mg/dL (ref 8–23)
CO2: 27 mmol/L (ref 22–32)
Calcium: 9.9 mg/dL (ref 8.9–10.3)
Chloride: 102 mmol/L (ref 98–111)
Creatinine, Ser: 1.58 mg/dL — ABNORMAL HIGH (ref 0.61–1.24)
GFR, Estimated: 48 mL/min — ABNORMAL LOW (ref >60)
Glucose, Bld: 76 mg/dL (ref 70–99)
Potassium: 4.6 mmol/L (ref 3.5–5.1)
Sodium: 138 mmol/L (ref 135–145)

## 2024-08-14 LAB — PSA, TOTAL AND FREE
PSA, Free Pct: 20.9 %
PSA, Free: 1.94 ng/mL
Prostate Specific Ag, Serum: 9.3 ng/mL — ABNORMAL HIGH (ref 0.0–4.0)

## 2024-08-14 LAB — HEMOGLOBIN A1C
Hgb A1c MFr Bld: 5.9 % — ABNORMAL HIGH (ref 4.8–5.6)
Mean Plasma Glucose: 123 mg/dL

## 2024-08-24 DIAGNOSIS — R972 Elevated prostate specific antigen [PSA]: Secondary | ICD-10-CM | POA: Diagnosis not present

## 2024-09-02 ENCOUNTER — Other Ambulatory Visit: Payer: Self-pay

## 2024-09-02 ENCOUNTER — Encounter: Payer: Self-pay | Admitting: Orthopedic Surgery

## 2024-09-02 ENCOUNTER — Other Ambulatory Visit (INDEPENDENT_AMBULATORY_CARE_PROVIDER_SITE_OTHER): Payer: Self-pay

## 2024-09-02 ENCOUNTER — Ambulatory Visit: Admitting: Orthopedic Surgery

## 2024-09-02 VITALS — BP 140/86 | HR 76 | Ht 70.0 in | Wt 135.0 lb

## 2024-09-02 DIAGNOSIS — I10 Essential (primary) hypertension: Secondary | ICD-10-CM | POA: Diagnosis not present

## 2024-09-02 DIAGNOSIS — M48061 Spinal stenosis, lumbar region without neurogenic claudication: Secondary | ICD-10-CM | POA: Diagnosis not present

## 2024-09-02 DIAGNOSIS — M160 Bilateral primary osteoarthritis of hip: Secondary | ICD-10-CM

## 2024-09-02 DIAGNOSIS — R030 Elevated blood-pressure reading, without diagnosis of hypertension: Secondary | ICD-10-CM | POA: Insufficient documentation

## 2024-09-02 DIAGNOSIS — M25551 Pain in right hip: Secondary | ICD-10-CM

## 2024-09-02 DIAGNOSIS — N182 Chronic kidney disease, stage 2 (mild): Secondary | ICD-10-CM | POA: Diagnosis not present

## 2024-09-02 DIAGNOSIS — R42 Dizziness and giddiness: Secondary | ICD-10-CM | POA: Insufficient documentation

## 2024-09-02 NOTE — Progress Notes (Signed)
  Intake history:  Chief Complaint  Patient presents with   Back Pain    Buttocks down backs of both legs into knees    Hip Pain    Anterior hip pain in addition to the buttock/ leg pain     BP (!) 140/86   Pulse 76   Ht 5' 10 (1.778 m)   Wt 135 lb (61.2 kg)   BMI 19.37 kg/m  Body mass index is 19.37 kg/m.  Pharmacy? ___[facility] [address]___________________________________  WHAT ARE WE SEEING YOU FOR TODAY?   Back and hips   How long has this bothered you? (DOI?DOS?WS?)  Years   Was there an injury? No  Anticoag.  No   Any ALLERGIES ________ Allergies  Allergen Reactions   Penicillins Other (See Comments)    PT SAID HE PASSED OUT MANY YEARS AGO FROM PENICILLIN   ______________________________________   Treatment:  Have you taken:  Tylenol Yes  Advil Yes  Had PT Yes  Had injection Yes  Other  ______________PT and injections 2-3 years ago ___________

## 2024-09-02 NOTE — Patient Instructions (Addendum)
 While we are working on your approval for MRI please go ahead and call to schedule your appointment with [facility] within at least one (1) week.   Central Scheduling [phone]   We are referring you to [facility] from [facility] address is [address] [place] The phone number is [phone]  The office will call you with an appointment Dr. Sulema Endo

## 2024-09-02 NOTE — Progress Notes (Signed)
 Office Visit Note   Patient: Ryan Frost           Date of Birth: Mar 16, 1956           MRN: [id number] Visit Date: 09/02/2024 Requested by: Carlette Benita Area, MD [address] [place],  [place] [address] PCP: Carlette Benita Area, MD   Assessment & Plan:   Encounter Diagnosis  Name Primary?   Bilateral hip pain Yes    No orders of the defined types were placed in this encounter.  68 year old male with 2 problems #1 he has clinically spinal stenosis this is confirmed somewhat on x-ray but needs MRI for further evaluation  He also has bilateral hip arthritis but has adequate weightbearing area cartilage which should last 3 to 5 years based on his weight of 135 pounds and BMI of only 19  Recommend MRI of the spine to evaluate the neural elements  Then we can refer him to Dr. Georgina for further evaluation and management    Subjective: Chief Complaint  Patient presents with   Back Pain    Buttocks down backs of both legs into knees    Hip Pain    Anterior hip pain in addition to the buttock/ leg pain    HPI: 68 year old male was referred to us  for back pain but says he also has bilateral anterior hip pain and thigh pain  He says he has had his back and buttock pain for several years she has been treated nonoperatively before with Tylenol Advil and he said he had some kind of injection in his hips but he is not sure what they were or if it was actually the hip or the back  In any event the pain in his buttocks radiates down the backs of his legs to his knees              ROS: Bowel and bladder function seem to be intact, he does see the urologist or has seen a urologist in the past   Images personally read and my interpretation :  DG HIP UNILAT WITH PELVIS 2-3 VIEWS RIGHT Result Date: 09/02/2024 Pelvis with AP lateral right hip Again we see general weightbearing area preserved inferior osteophytes on the femur near the head as well as the superior  aspect.  There is also inferior wear in the head acetabular area but the superior weightbearing area remains intact Impression moderate arthritis of the right hip   DG HIP UNILAT WITH PELVIS 2-3 VIEWS LEFT Result Date: 09/02/2024 Bilateral hips Images Left anterior thigh pain Right anterior thigh pain X-rays show degeneration of the hip joint primarily inferiorly with some spurs along the head area superiorly but the general weightbearing area still preserved Moderate arthritis left hip     Visit Diagnoses:  1. Bilateral hip pain      Follow-Up Instructions: No follow-ups on file.    Objective: Vital Signs: BP (!) 140/86   Pulse 76   Ht 5' 10 (1.778 m)   Wt 135 lb (61.2 kg)   BMI 19.37 kg/m   Physical Exam Vitals and nursing note reviewed.  Constitutional:      Appearance: Normal appearance.  HENT:     Head: Normocephalic and atraumatic.  Eyes:     General: No scleral icterus.       Right eye: No discharge.        Left eye: No discharge.     Extraocular Movements: Extraocular movements intact.     Conjunctiva/sclera: Conjunctivae  normal.     Pupils: Pupils are equal, round, and reactive to light.  Cardiovascular:     Rate and Rhythm: Normal rate.     Pulses: Normal pulses.  Skin:    General: Skin is warm and dry.     Capillary Refill: Capillary refill takes less than 2 seconds.  Neurological:     General: No focal deficit present.     Mental Status: He is alert and oriented to person, place, and time.  Psychiatric:        Mood and Affect: Mood normal.        Behavior: Behavior normal.        Thought Content: Thought content normal.        Judgment: Judgment normal.      Ortho Exam  His gait is somewhat crouched  Right hip flexion 120 left hip 115 with painful internal and external rotation of right and left hip  Leg lengths remain equal  Hint of flexion contracture both knees    Specialty Comments:  No specialty comments available.  Imaging: No  results found.   PMFS History: Patient Active Problem List   Diagnosis Date Noted   Elevated blood pressure reading 09/02/2024   Episodic lightheadedness 09/02/2024   History of colonic polyps    Diverticulosis of colon with hemorrhage    Diverticulosis of colon without hemorrhage    Past Medical History:  Diagnosis Date   Medical history non-contributory    Red eye    left eye    History reviewed. No pertinent family history.  Past Surgical History:  Procedure Laterality Date   COLONOSCOPY N/A 11/14/2014   Procedure: COLONOSCOPY;  Surgeon: Lamar CHRISTELLA Hollingshead, MD;  Location: [facility];  Service: Endoscopy;  Laterality: N/A;  9.30 AM   TONSILLECTOMY     Social History   Occupational History   Not on file  Tobacco Use   Smoking status: Never   Smokeless tobacco: Never  Substance and Sexual Activity   Alcohol  use: Yes    Comment: 2 or 3 liquor on weekend   Drug use: Never   Sexual activity: Not on file

## 2024-09-04 ENCOUNTER — Ambulatory Visit (HOSPITAL_COMMUNITY)
Admission: RE | Admit: 2024-09-04 | Discharge: 2024-09-04 | Disposition: A | Discharge status: Home / Self Care | Source: Ambulatory Visit | Attending: Orthopedic Surgery | Admitting: Orthopedic Surgery

## 2024-09-04 DIAGNOSIS — M5137 Other intervertebral disc degeneration, lumbosacral region with discogenic back pain only: Secondary | ICD-10-CM | POA: Diagnosis not present

## 2024-09-04 DIAGNOSIS — M4187 Other forms of scoliosis, lumbosacral region: Secondary | ICD-10-CM | POA: Diagnosis not present

## 2024-09-04 DIAGNOSIS — M48061 Spinal stenosis, lumbar region without neurogenic claudication: Secondary | ICD-10-CM | POA: Insufficient documentation

## 2024-09-04 DIAGNOSIS — M5136 Other intervertebral disc degeneration, lumbar region with discogenic back pain only: Secondary | ICD-10-CM | POA: Diagnosis not present

## 2024-10-11 ENCOUNTER — Ambulatory Visit: Admitting: Orthopedic Surgery

## 2024-10-11 ENCOUNTER — Other Ambulatory Visit: Payer: Self-pay

## 2024-10-11 DIAGNOSIS — M545 Low back pain, unspecified: Secondary | ICD-10-CM

## 2024-10-13 NOTE — Progress Notes (Signed)
 Orthopedic Spine Surgery Office Note  Assessment: Patient is a 68 y.o. male with low back pain that radiates into bilateral lateral hips.  No significant stenosis seen on his MRI   Plan: -Explained that initially conservative treatment is tried as a significant number of patients may experience relief with these treatment modalities. Discussed that the conservative treatments include:  -activity modification  -physical therapy  -over the counter pain medications  -medrol dosepak  -lumbar steroid injections -Patient has tried Tylenol, ibuprofen, cyclobenzaprine - I do not see any significant stenosis for which surgery would provide him with predictable relief, so discussed pain management as an option.  He was interested in that.  Referral provided to him today -Patient should return to office on an as needed basis   Patient expressed understanding of the plan and all questions were answered to the patient's satisfaction.   ___________________________________________________________________________   History:  Patient is a 68 y.o. male who presents today for lumbar spine.  Patient has had years of low back pain.  He is also noted pain going into his bilateral lateral hips.  He has seen one of my general orthopedic partners who did not feel that this was coming from his hips.  He was sent to me for possible radicular pain.  He notes the pain on a daily basis.  Pain has been getting progressively worse with time.  He has not found any conservative treatment so far that provided him with significant or lasting relief.   Weakness: Denies Symptoms of imbalance: Denies Paresthesias and numbness: Denies Bowel or bladder incontinence: Denies Saddle anesthesia: Denies  Treatments tried: Tylenol, ibuprofen, cyclobenzaprine  Review of systems: Denies fevers and chills, night sweats, unexplained weight loss, history of cancer.  Has had pain that wakes him at night  Past medical  history: BPH Diverticulosis  Allergies: penicillin  Past surgical history: Tonsillectomy  Social history: Denies use of nicotine product (smoking, vaping, patches, smokeless) Alcohol  use: Denies Reports marijuana use, denies other recreational drugs   Physical Exam:  General: no acute distress, appears stated age Neurologic: alert, answering questions appropriately, following commands Respiratory: unlabored breathing on room air, symmetric chest rise Psychiatric: appropriate affect, normal cadence to speech   MSK (spine):  -Strength exam      Left  Right EHL    5/5  5/5 TA    5/5  5/5 GSC    5/5  5/5 Knee extension  5/5  5/5 Hip flexion   5/5  5/5  -Sensory exam    Sensation intact to light touch in L3-S1 nerve distributions of bilateral lower extremities  -Achilles DTR: 2/4 on the left, 2/4 on the right -Patellar tendon DTR: 2/4 on the left, 2/4 on the right  -Straight leg raise: Negative bilaterally -Clonus: no beats bilaterally  Imaging: XRs of the lumbar spine from 10/11/2024 were independently reviewed and interpreted, showing disc height loss at L4/5 and L5/S1.  Vacuum disc phenomenon seen on the extension views at L5/S1.  No fracture or dislocation seen.  No evidence of instability on flexion/extension views.  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  No significant central, lateral recess, or foraminal stenosis seen.   Patient name: Ryan Frost MRN: [id number] Date of visit: 10/11/2024

## 2024-11-26 ENCOUNTER — Other Ambulatory Visit: Payer: Self-pay | Admitting: Gerontology

## 2024-11-26 DIAGNOSIS — R10812 Left upper quadrant abdominal tenderness: Secondary | ICD-10-CM

## 2024-11-26 DIAGNOSIS — N1831 Chronic kidney disease, stage 3a: Secondary | ICD-10-CM

## 2024-12-07 ENCOUNTER — Ambulatory Visit (HOSPITAL_COMMUNITY)
